# Patient Record
Sex: Female | Born: 1965
Health system: Southern US, Community
[De-identification: ages and names within clinical notes are randomized; demographics above are authoritative.]

## PROBLEM LIST (undated history)

## (undated) DIAGNOSIS — E785 Hyperlipidemia, unspecified: Secondary | ICD-10-CM

## (undated) HISTORY — PX: OTHER SURGICAL HISTORY: SHX169

---

## 2011-07-17 ENCOUNTER — Encounter (HOSPITAL_COMMUNITY): Payer: Self-pay

## 2012-07-16 ENCOUNTER — Other Ambulatory Visit: Payer: Self-pay | Admitting: Obstetrics and Gynecology

## 2012-07-16 ENCOUNTER — Encounter (HOSPITAL_COMMUNITY): Payer: Self-pay

## 2012-07-16 ENCOUNTER — Ambulatory Visit (HOSPITAL_COMMUNITY)
Admission: RE | Admit: 2012-07-16 | Discharge: 2012-07-16 | Disposition: A | Discharge status: Home / Self Care | Payer: Self-pay | Source: Ambulatory Visit | Attending: Obstetrics and Gynecology | Admitting: Obstetrics and Gynecology

## 2012-07-16 VITALS — BP 108/64 | Temp 97.8°F | Ht 61.0 in | Wt 139.6 lb

## 2012-07-16 DIAGNOSIS — N644 Mastodynia: Secondary | ICD-10-CM

## 2012-07-16 DIAGNOSIS — Z1239 Encounter for other screening for malignant neoplasm of breast: Secondary | ICD-10-CM

## 2012-07-16 HISTORY — DX: Hyperlipidemia, unspecified: E78.5

## 2012-07-16 NOTE — Progress Notes (Signed)
Complaints of change in size and tenderness in right breast   Pap Smear:    Pap smear not performed today. Per patient last Pap smear was November 2012 at [facility] and normal. Per patient she has no history of abnormal Pap smears. No Pap smear results in EPIC.  Physical exam: Breasts Right breast slightly larger than left breast. Per patient this is a change. No skin abnormalities bilateral breasts. No nipple retraction bilateral breasts. No nipple discharge bilateral breasts. No lymphadenopathy. No lumps palpated bilateral breasts. Patient complained of tenderness when palpated right outer breast and left lower breast.  Patient referred to the [facility] for Diagnostic Mammogram. Appointment scheduled for Monday, July 22, 2012 at 1430.        Pelvic/Bimanual No Pap smear completed today since last Pap smear was November 2012 and normal. Pap smear not indicated per BCCCP guidelines.

## 2012-07-16 NOTE — Patient Instructions (Signed)
Taught patient how to perform BSE and gave educational materials to take home. Patient did not need a Pap smear today due to last Pap smear was November 2012 per patient. Let her know BCCCP will cover Pap smears every 3 years unless has a history of abnormal Pap smears. Patient referred to the [facility] for Diagnostic Mammogram. Appointment scheduled for Monday, July 22, 2012 at 1430. Told patient she will need to get her films from her previous mammogram sent to the [facility] prior to her appointment. Patient aware of appointment and will be there. Patient verbalized understanding.

## 2012-07-31 ENCOUNTER — Ambulatory Visit
Admission: RE | Admit: 2012-07-31 | Discharge: 2012-07-31 | Disposition: A | Discharge status: Home / Self Care | Payer: No Typology Code available for payment source | Source: Ambulatory Visit | Attending: Obstetrics and Gynecology | Admitting: Obstetrics and Gynecology

## 2012-07-31 ENCOUNTER — Other Ambulatory Visit: Payer: Self-pay | Admitting: Obstetrics and Gynecology

## 2012-07-31 DIAGNOSIS — N644 Mastodynia: Secondary | ICD-10-CM

## 2013-01-29 DIAGNOSIS — K219 Gastro-esophageal reflux disease without esophagitis: Secondary | ICD-10-CM | POA: Insufficient documentation

## 2014-05-11 ENCOUNTER — Encounter (HOSPITAL_COMMUNITY): Payer: Self-pay

## 2015-02-26 ENCOUNTER — Encounter (HOSPITAL_COMMUNITY): Payer: Self-pay | Admitting: *Deleted

## 2015-02-26 ENCOUNTER — Emergency Department (HOSPITAL_COMMUNITY)
Admission: EM | Admit: 2015-02-26 | Discharge: 2015-03-01 | Disposition: A | Discharge status: Home / Self Care | Payer: Self-pay | Attending: Emergency Medicine | Admitting: Emergency Medicine

## 2015-02-26 DIAGNOSIS — F4329 Adjustment disorder with other symptoms: Secondary | ICD-10-CM | POA: Diagnosis present

## 2015-02-26 DIAGNOSIS — F4325 Adjustment disorder with mixed disturbance of emotions and conduct: Secondary | ICD-10-CM | POA: Insufficient documentation

## 2015-02-26 DIAGNOSIS — Z8639 Personal history of other endocrine, nutritional and metabolic disease: Secondary | ICD-10-CM | POA: Insufficient documentation

## 2015-02-26 DIAGNOSIS — F29 Unspecified psychosis not due to a substance or known physiological condition: Secondary | ICD-10-CM | POA: Diagnosis not present

## 2015-02-26 DIAGNOSIS — Z3202 Encounter for pregnancy test, result negative: Secondary | ICD-10-CM | POA: Insufficient documentation

## 2015-02-26 LAB — RAPID URINE DRUG SCREEN, HOSP PERFORMED
AMPHETAMINES: NOT DETECTED
BARBITURATES: NOT DETECTED
Benzodiazepines: NOT DETECTED
Cocaine: NOT DETECTED
OPIATES: NOT DETECTED
TETRAHYDROCANNABINOL: NOT DETECTED

## 2015-02-26 LAB — CBC
HEMATOCRIT: 38 % (ref 36.0–46.0)
HEMOGLOBIN: 12.6 g/dL (ref 12.0–15.0)
MCH: 27.2 pg (ref 26.0–34.0)
MCHC: 33.2 g/dL (ref 30.0–36.0)
MCV: 82.1 fL (ref 78.0–100.0)
Platelets: 253 10*3/uL (ref 150–400)
RBC: 4.63 MIL/uL (ref 3.87–5.11)
RDW: 13.4 % (ref 11.5–15.5)
WBC: 6.7 10*3/uL (ref 4.0–10.5)

## 2015-02-26 LAB — COMPREHENSIVE METABOLIC PANEL
ALBUMIN: 4.8 g/dL (ref 3.5–5.0)
ALK PHOS: 72 U/L (ref 38–126)
ALT: 17 U/L (ref 14–54)
AST: 22 U/L (ref 15–41)
Anion gap: 6 (ref 5–15)
BUN: 10 mg/dL (ref 6–20)
CHLORIDE: 108 mmol/L (ref 101–111)
CO2: 27 mmol/L (ref 22–32)
CREATININE: 0.43 mg/dL — AB (ref 0.44–1.00)
Calcium: 9.2 mg/dL (ref 8.9–10.3)
GFR calc Af Amer: >60 mL/min (ref >60)
GFR calc non Af Amer: >60 mL/min (ref >60)
GLUCOSE: 100 mg/dL — AB (ref 65–99)
Potassium: 3.8 mmol/L (ref 3.5–5.1)
SODIUM: 141 mmol/L (ref 135–145)
Total Bilirubin: 0.4 mg/dL (ref 0.3–1.2)
Total Protein: 7.9 g/dL (ref 6.5–8.1)

## 2015-02-26 LAB — SALICYLATE LEVEL

## 2015-02-26 LAB — ETHANOL: Alcohol, Ethyl (B): <5 mg/dL (ref <5)

## 2015-02-26 LAB — POC URINE PREG, ED: Preg Test, Ur: NEGATIVE

## 2015-02-26 LAB — ACETAMINOPHEN LEVEL: Acetaminophen (Tylenol), Serum: <10 ug/mL — ABNORMAL LOW (ref 10–30)

## 2015-02-26 NOTE — ED Provider Notes (Signed)
CSN: [id number]     Arrival date & time 02/26/15  1402 History  This chart was scribed for non-physician practitioner, Langston Masker, PA-C, working with Lorre Nick, MD, by Ronney Lion, ED Scribe. This patient was seen in room WTR3/[facility]PT3 and the patient's care was started at 3:46 PM.      Chief Complaint  Patient presents with  . IVC, Medical Clearance    The history is provided by the patient. No language interpreter was used.    HPI Comments: Melody Perkins is a 49 y.o. female who presents to the Emergency Department for IVC. Per nursing notes, patient's husband states she started acting strangely 2.5 years ago but has been acting increasingly strangely 1 month ago (see nursing note). Patient denies SI or HI. She denies a history of any chronic medical conditions, including hypertension or DM. She denies taking any daily medications. She denies ever having any counseling, including marital counseling. She denies abdominal pain.    Past Medical History  Diagnosis Date  . Hyperlipidemia    Past Surgical History  Procedure Laterality Date  . Cesarean section     Family History  Problem Relation Age of Onset  . Hyperlipidemia Father    Social History  Substance Use Topics  . Smoking status: Never Smoker   . Smokeless tobacco: Never Used  . Alcohol Use: No   OB History    Gravida Para Term Preterm AB TAB SAB Ectopic Multiple Living   4 2 0 Review of Systems  Gastrointestinal: Negative for abdominal pain.  Psychiatric/Behavioral: Negative for suicidal ideas.  All other systems reviewed and are negative.   Allergies  Buscopan  Home Medications   Prior to Admission medications   Not on File   BP 134/76 mmHg  Pulse 72  Temp(Src) 98.1 F (36.7 C) (Oral)  Resp 16  SpO2 95% Physical Exam  Constitutional: She is oriented to person, place, and time. She appears well-developed and well-nourished. No distress.  HENT:  Head: Normocephalic and atraumatic.  Eyes:  Conjunctivae and EOM are normal.  Neck: Neck supple. No tracheal deviation present.  Cardiovascular: Normal rate, regular rhythm, normal heart sounds and intact distal pulses.  Exam reveals no gallop and no friction rub.   No murmur heard. Pulmonary/Chest: Effort normal and breath sounds normal. No respiratory distress. She has no wheezes. She has no rales.  Abdominal: Soft. There is no tenderness.  Musculoskeletal: Normal range of motion.  Neurological: She is alert and oriented to person, place, and time.  Skin: Skin is warm and dry.  Psychiatric: She has a normal mood and affect. Her behavior is normal.  Nursing note and vitals reviewed.   ED Course  Procedures (including critical care time)  DIAGNOSTIC STUDIES: Oxygen Saturation is 95% on RA, adequate by my interpretation.    COORDINATION OF CARE: 3:49 PM - Discussed treatment plan with pt at bedside which includes telepsych. Pt verbalized understanding and agreed to plan.   Labs Review Labs Reviewed  COMPREHENSIVE METABOLIC PANEL - Abnormal; Notable for the following:    Glucose, Bld 100 (*)    Creatinine, Ser 0.43 (*)    All other components within normal limits  ACETAMINOPHEN LEVEL - Abnormal; Notable for the following:    Acetaminophen (Tylenol), Serum <10 (*)    All other components within normal limits  ETHANOL  SALICYLATE LEVEL  CBC  URINE RAPID DRUG SCREEN, HOSP PERFORMED  POC URINE PREG, ED  Imaging Review No results found. I have personally reviewed and evaluated these images and lab results as part of my medical decision-making.   EKG Interpretation None      MDM   Final diagnoses:  Medical clearance for psychiatric admission   I personally performed the services in this documentation, which was scribed in my presence.  The recorded information has been reviewed and considered.   Barnet Pall.    Lonia Skinner [place], PA-C 02/26/15 1939  Lorre Nick, MD 02/27/15 [phone]
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
## 2015-02-26 NOTE — ED Notes (Signed)
TTS unsure if pt needed to stay, pt IVC. Dr Freida Busman stated that psychiatry can resend IVC paperwork, but EDPs were not going to resend IVC paperwork.

## 2015-02-26 NOTE — ED Notes (Signed)
Bed: [facility]3 Expected date:  Expected time:  Means of arrival:  Comments: Pt still in room 

## 2015-02-26 NOTE — ED Notes (Signed)
Pt AAO x 3, NP Ijama at beside to eval pt.  No distress noted.  Monitoring for safety, Q 15 min checks in effect.
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
## 2015-02-26 NOTE — ED Notes (Signed)
pts husband took all belongings home.
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
## 2015-02-27 DIAGNOSIS — F323 Major depressive disorder, single episode, severe with psychotic features: Secondary | ICD-10-CM

## 2015-02-27 DIAGNOSIS — F29 Unspecified psychosis not due to a substance or known physiological condition: Secondary | ICD-10-CM | POA: Diagnosis not present

## 2015-02-27 MED ORDER — ACETAMINOPHEN 325 MG PO TABS
650.0000 mg | ORAL_TABLET | Freq: Four times a day (QID) | ORAL | Status: DC | PRN
  Administered 2015-02-27 – 2015-03-01 (×2): 650 mg via ORAL
  Filled 2015-02-27 (×2): qty 2

## 2015-02-27 NOTE — Progress Notes (Signed)
Disposition CSW completed patient referrals for the following inpatient psych facilities:  [facility]  CSW will continue to assist with placement.  [facility] Disposition CSW [phone]
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
## 2015-02-27 NOTE — ED Notes (Signed)
Pt AAO x 3, no distress noted, calm & cooperative, watching TV at present,  Pt given supplies for comfort care, requesting to shower.  Monitoring for safety, Q 15 min checks in effect.

## 2015-02-28 NOTE — BHH Suicide Risk Assessment (Signed)
  Discharge Assessment   [facility] Discharge Suicide Risk Assessment   Demographic Factors:  NA  Total Time spent with patient: 25 minutes  Musculoskeletal: Strength & Muscle Tone: within normal limits Gait & Station: normal Patient leans: N/A  Psychiatric Specialty Exam:     Blood pressure 130/75, pulse 67, temperature 98.4 F (36.9 C), temperature source Oral, resp. rate 16, SpO2 99 %.There is no weight on file to calculate BMI.    General Appearance: Casual and Guarded  Eye Contact:: Fair  Speech: Clear and Coherent and Normal Rate  Volume: Decreased  Mood: Anxious  Affect: Constricted and Depressed  Thought Process: Coherent and Goal Directed  Orientation: Full (Time, Place, and Person)  Thought Content: Patient denies any hallucination or any paranoia  Suicidal Thoughts: No  Homicidal Thoughts: No  Memory: Immediate; Fair Recent; Fair Remote; Fair  Judgement: Fair  Insight: Fair  Psychomotor Activity: Decreased  Concentration: Fair  Recall: [employer] of Knowledge:Fair  Language: Fair  Akathisia: No  Handed: Right  AIMS (if indicated):    Assets: Communication Skills Desire for Improvement Housing  ADL's: Intact  Cognition: WNL  Sleep:               Has this patient used any form of tobacco in the last 30 days? (Cigarettes, Smokeless Tobacco, Cigars, and/or Pipes) No  Mental Status Per Nursing Assessment::   On Admission:     Current Mental Status by Physician: Pt is alert/oriented x4, calm, cooperative, and appropriate for discharge. Denies suicidal/homicidal ideation and psychosis and does not appear to be responding to internal stimuli.   Loss Factors: Decrease in vocational status  Historical Factors: Impulsivity  Risk Reduction Factors:   Positive social support and Positive therapeutic relationship  Continued Clinical Symptoms:  Depression:   Impulsivity  Cognitive Features That  Contribute To Risk:  Closed-mindedness    Suicide Risk:  Minimal: No identifiable suicidal ideation.  Patients presenting with no risk factors but with morbid ruminations; may be classified as minimal risk based on the severity of the depressive symptoms  Principal Problem: Psychosis Discharge Diagnoses:  Patient Active Problem List   Diagnosis Date Noted  . Psychosis [F29] 02/27/2015  . Pain in breast [N64.4] 07/16/2012      Plan Of Care/Follow-up recommendations:  Activity:  As tolerated Diet:  Heart healthy with low sodium  Is patient on multiple antipsychotic therapies at discharge:  No   Has Patient had three or more failed trials of antipsychotic monotherapy by history:  No  Recommended Plan for Multiple Antipsychotic Therapies: NA  [place], John C, FNP-BC 02/28/2015, 1:10 PM
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
## 2015-02-28 NOTE — ED Notes (Signed)
[employer] at bedside to speak with pt also.
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
## 2015-02-28 NOTE — ED Notes (Signed)
Pt AAO x 3, no distress noted, calm & cooperative, interactive with staff,  Denies SI or HI.  Monitoring for safety, Q 15 min checks in effect.

## 2015-02-28 NOTE — BH Assessment (Signed)
Pt has been declined by [facility] due to aggression.

## 2015-02-28 NOTE — BH Assessment (Signed)
IVC paperwork has been faxed to [facility].
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
## 2015-03-01 DIAGNOSIS — F4329 Adjustment disorder with other symptoms: Secondary | ICD-10-CM | POA: Diagnosis present

## 2015-03-01 NOTE — Progress Notes (Signed)
Pt plans to discharge home with a friend. Pt to go to house to obtain belongings with police escort. Patient provided number [phone] if arrived to home and police escort not there. Patient also plans to file restraining order against spouse. Pt plans to go stay with friend. Pt thanked csw for concern and support.   Olga Coaster, LCSW  Clinical Social Work  [employer] [phone]

## 2015-03-01 NOTE — Discharge Instructions (Signed)
For your ongoing behavioral health needs you are advised to follow up with [facility].  New patients are seen at their walk-in clinic.  Walk-in hours are Monday - Friday from 8:00 am - 12:00 pm, and from 1:00 pm - 3:00 pm.  Walk-in patients are seen on a first come, first served basis, so try to arrive as early as possible for the best chance of being seen the same day.  There is an initial fee of $22.50: ° °     [facility] °     [address] °     [place], [place] [address] °     [phone] °
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
## 2015-03-01 NOTE — ED Notes (Signed)
Patient discharged with friend.  Denies thoughts of harm to self or others.  Denies auditory or visual hallucinations.  Patient states she is going to go to her friends home, but wants to get some personal effects and her passport.  She is afraid that her husband may not let her in to get them.  She was escorted out the side door where her friend picked her up.  When I came back to the unit I called [employer] non-emergent line and asked to have an officer dispatched to her home.  They will do that.  All belongings were given to the patient.

## 2015-03-01 NOTE — BH Assessment (Signed)
BHH Assessment Progress Note  Per Thedore Mins, MD, this pt does not require psychiatric hospitalization at this time. She is to be released from [facility] and discharged from [facility] with outpatient referrals. IVC has been rescinded. Discharge instructions include referral to [facility]. Pt's nurse, Dawnaly, has been notified.  Doylene Canning, MA Triage Specialist [phone]
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
## 2015-07-09 DIAGNOSIS — E785 Hyperlipidemia, unspecified: Secondary | ICD-10-CM | POA: Insufficient documentation

## 2016-02-25 ENCOUNTER — Ambulatory Visit (INDEPENDENT_AMBULATORY_CARE_PROVIDER_SITE_OTHER): Payer: 59

## 2016-02-25 ENCOUNTER — Encounter: Payer: Self-pay | Admitting: Family Medicine

## 2016-02-25 ENCOUNTER — Ambulatory Visit (INDEPENDENT_AMBULATORY_CARE_PROVIDER_SITE_OTHER): Payer: 59 | Admitting: Family Medicine

## 2016-02-25 DIAGNOSIS — M545 Low back pain, unspecified: Secondary | ICD-10-CM

## 2016-02-25 MED ORDER — CYCLOBENZAPRINE HCL 10 MG PO TABS: 10.0000 mg | ORAL_TABLET | Freq: Three times a day (TID) | ORAL | 0 refills | Status: DC | PRN

## 2016-02-25 MED ORDER — MELOXICAM 15 MG PO TABS: 15.0000 mg | ORAL_TABLET | Freq: Every day | ORAL | 1 refills | Status: DC

## 2016-02-25 NOTE — Patient Instructions (Addendum)
Follow-up as needed!  You x-ray was normal. . . cyclobenzaprine (FLEXERIL) 10 MG tablet    Sig: Take 1 tablet (10 mg total) by mouth 3 (three) times daily as needed for muscle spasms.  . meloxicam (MOBIC) 15 MG tablet    Sig: Take 1 tablet (15 mg total) by mouth daily.    IF you received an x-ray today, you will receive an invoice from [facility]. Please contact [facility] at [phone] with questions or concerns regarding your invoice.   IF you received labwork today, you will receive an invoice from [facility]. Please contact [facility] at [phone] with questions or concerns regarding your invoice.   Our billing staff will not be able to assist you with questions regarding bills from these companies.  You will be contacted with the lab results as soon as they are available. The fastest way to get your results is to activate your My Chart account. Instructions are located on the last page of this paperwork. If you have not heard from us regarding the results in 2 weeks, please contact this office.     Back Pain, Adult Back pain is very common in adults.The cause of back pain is rarely dangerous and the pain often gets better over time.The cause of your back pain may not be known. Some common causes of back pain include:  Strain of the muscles or ligaments supporting the spine.  Wear and tear (degeneration) of the spinal disks.  Arthritis.  Direct injury to the back. For many people, back pain may return. Since back pain is rarely dangerous, most people can learn to manage this condition on their own. HOME CARE INSTRUCTIONS Watch your back pain for any changes. The following actions may help to lessen any discomfort you are feeling:  Remain active. It is stressful on your back to sit or stand in one place for long periods of time. Do not sit, drive, or stand in one place for more than 30 minutes at a time. Take short walks on even  surfaces as soon as you are able.Try to increase the length of time you walk each day.  Exercise regularly as directed by your health care provider. Exercise helps your back heal faster. It also helps avoid future injury by keeping your muscles strong and flexible.  Do not stay in bed.Resting more than 1-2 days can delay your recovery.  Pay attention to your body when you bend and lift. The most comfortable positions are those that put less stress on your recovering back. Always use proper lifting techniques, including:  Bending your knees.  Keeping the load close to your body.  Avoiding twisting.  Find a comfortable position to sleep. Use a firm mattress and lie on your side with your knees slightly bent. If you lie on your back, put a pillow under your knees.  Avoid feeling anxious or stressed.Stress increases muscle tension and can worsen back pain.It is important to recognize when you are anxious or stressed and learn ways to manage it, such as with exercise.  Take medicines only as directed by your health care provider. Over-the-counter medicines to reduce pain and inflammation are often the most helpful.Your health care provider may prescribe muscle relaxant drugs.These medicines help dull your pain so you can more quickly return to your normal activities and healthy exercise.  Apply ice to the injured area:  Put ice in a plastic bag.  Place a towel between your skin and the bag.  Leave  the ice on for 20 minutes, 2-3 times a day for the first 2-3 days. After that, ice and heat may be alternated to reduce pain and spasms.  Maintain a healthy weight. Excess weight puts extra stress on your back and makes it difficult to maintain good posture. SEEK MEDICAL CARE IF:  You have pain that is not relieved with rest or medicine.  You have increasing pain going down into the legs or buttocks.  You have pain that does not improve in one week.  You have night pain.  You lose  weight.  You have a fever or chills. SEEK IMMEDIATE MEDICAL CARE IF:   You develop new bowel or bladder control problems.  You have unusual weakness or numbness in your arms or legs.  You develop nausea or vomiting.  You develop abdominal pain.  You feel faint.   This information is not intended to replace advice given to you by your health care provider. Make sure you discuss any questions you have with your health care provider.   Document Released: 06/26/2005 Document Revised: 07/17/2014 Document Reviewed: 10/28/2013 Elsevier Interactive Patient Education [employer]2016 Elsevier Inc.

## 2016-02-25 NOTE — Progress Notes (Signed)
Patient ID: Melody Perkins, female    DOB: 09-19-1965, 50 y.o.   MRN: [id number]  PCP: No PCP Per Patient  Chief Complaint  Patient presents with  . Back Pain    YESTERDAY    Subjective:   HPI Presents for evaluation of injured back while bending two days ago.  She was bending orward to pick up an object off the floor and felt the sensation of something "popping" in her lower lumbar-sacral region.She reports some difficulty walking and sitting down. Rate pain 8/10. Pain improves with lying flat.  She has used biofreeze and advil with minimal relief of symptoms.  . Social History   Social History  . Marital status: Married    Spouse name: N/A  . Number of children: N/A  . Years of education: N/A   Occupational History  . Not on file.   Social History Main Topics  . Smoking status: Never Smoker  . Smokeless tobacco: Never Used  . Alcohol use No  . Drug use: No  . Sexual activity: Not on file   Other Topics Concern  . Not on file   Social History Narrative  . No narrative on file   . Family History  Problem Relation Age of Onset  . Hyperlipidemia Father     Review of Systems  Musculoskeletal: Positive for back pain.       Patient Active Problem List   Diagnosis Date Noted  . Adjustment disorder with disturbance of emotion 03/01/2015  . Pain in breast 07/16/2012     Prior to Admission medications   Not on File     Allergies  Allergen Reactions  . Buscopan [Scopolamine] Palpitations       Objective:  Physical Exam  Constitutional: She is oriented to person, place, and time. She appears well-developed and well-nourished.  HENT:  Head: Normocephalic and atraumatic.  Right Ear: External ear normal.  Left Ear: External ear normal.  Nose: Nose normal.  Eyes: Pupils are equal, round, and reactive to light.  Neck: Normal range of motion.  Cardiovascular: Normal rate and regular rhythm.   Pulmonary/Chest: Effort normal and breath sounds normal.    Musculoskeletal:  Grimaces with walking. DTR patellar and Achilles intact, brisk. Active flexion (reports tension instead of pain).   Neurological: She is alert and oriented to person, place, and time.  Skin: Skin is warm and dry.  Psychiatric: She has a normal mood and affect. Her behavior is normal. Judgment and thought content normal.   .Dg Lumbar Spine Complete  Result Date: 02/25/2016 CLINICAL DATA:  Back pain. EXAM: LUMBAR SPINE - COMPLETE 4+ VIEW COMPARISON:  No recent prior . FINDINGS: No acute bony abnormality identified. Normal alignment and mineralization. IMPRESSION: No acute or focal abnormality. Electronically Signed   By: Maisie Fushomas  Register   On: 02/25/2016 11:16   Vitals:   02/25/16 1035  BP: 120/70  Pulse: (!) 51  Resp: 16  Temp: 98.1 F (36.7 C)   Assessment & Plan:  .1. Bilateral low back pain without sciatica - DG Lumbar Spine Complete-no acute findings  Start:  . cyclobenzaprine (FLEXERIL) 10 MG tablet    Sig: Take 1 tablet (10 mg total) by mouth 3 (three) times daily as needed for muscle spasms.  . meloxicam (MOBIC) 15 MG tablet    Sig: Take 1 tablet (15 mg total) by mouth daily.   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  Follow-up as needed.  Godfrey PickKimberly S. Tiburcio PeaHarris, MSN, FNP-C [facility]

## 2016-03-23 ENCOUNTER — Telehealth: Payer: Self-pay

## 2016-03-23 ENCOUNTER — Other Ambulatory Visit (HOSPITAL_COMMUNITY): Payer: Self-pay | Admitting: Psychiatry

## 2016-03-23 DIAGNOSIS — Z1231 Encounter for screening mammogram for malignant neoplasm of breast: Secondary | ICD-10-CM

## 2016-03-23 DIAGNOSIS — Z1239 Encounter for other screening for malignant neoplasm of breast: Secondary | ICD-10-CM

## 2016-03-23 NOTE — Telephone Encounter (Signed)
Patient request a referral to have a mammogram done. No concerns. Just for a general routine. u [phone].

## 2016-03-27 ENCOUNTER — Ambulatory Visit
Admission: RE | Admit: 2016-03-27 | Discharge: 2016-03-27 | Disposition: A | Discharge status: Home / Self Care | Payer: 59 | Source: Ambulatory Visit | Attending: Psychiatry | Admitting: Psychiatry

## 2016-03-27 ENCOUNTER — Other Ambulatory Visit: Payer: Self-pay | Admitting: Emergency Medicine

## 2016-03-27 DIAGNOSIS — Z1231 Encounter for screening mammogram for malignant neoplasm of breast: Secondary | ICD-10-CM | POA: Diagnosis not present

## 2016-03-28 NOTE — Telephone Encounter (Signed)
She was seen by Joaquin CourtsKimberly Harris, NP as the chart shows--not me.  I will place the mammogram. Please alert patient.

## 2016-03-29 NOTE — Telephone Encounter (Signed)
Thanks

## 2016-03-29 NOTE — Telephone Encounter (Signed)
Order has been sent to [facility]

## 2016-04-24 ENCOUNTER — Ambulatory Visit (INDEPENDENT_AMBULATORY_CARE_PROVIDER_SITE_OTHER): Payer: 59 | Admitting: Family Medicine

## 2016-04-24 VITALS — BP 150/94 | HR 58 | Temp 98.2°F | Resp 17 | Ht 61.5 in | Wt 143.0 lb

## 2016-04-24 DIAGNOSIS — K649 Unspecified hemorrhoids: Secondary | ICD-10-CM

## 2016-04-24 DIAGNOSIS — Z01419 Encounter for gynecological examination (general) (routine) without abnormal findings: Secondary | ICD-10-CM | POA: Diagnosis not present

## 2016-04-24 DIAGNOSIS — Z1211 Encounter for screening for malignant neoplasm of colon: Secondary | ICD-10-CM | POA: Diagnosis not present

## 2016-04-24 LAB — POCT WET + KOH PREP
Trich by wet prep: ABSENT
Yeast by KOH: ABSENT
Yeast by wet prep: ABSENT

## 2016-04-24 LAB — POCT URINALYSIS DIP (MANUAL ENTRY)
Bilirubin, UA: NEGATIVE
GLUCOSE UA: NEGATIVE
Ketones, POC UA: NEGATIVE
LEUKOCYTES UA: NEGATIVE
Nitrite, UA: NEGATIVE
PROTEIN UA: NEGATIVE
SPEC GRAV UA: 1.015
UROBILINOGEN UA: 0.2
pH, UA: 7

## 2016-04-24 LAB — POC MICROSCOPIC URINALYSIS (UMFC): Mucus: ABSENT

## 2016-04-24 NOTE — Patient Instructions (Signed)
     IF you received an x-ray today, you will receive an invoice from [facility]. Please contact [facility] at [phone] with questions or concerns regarding your invoice.   IF you received labwork today, you will receive an invoice from [facility]. Please contact [facility] at [phone] with questions or concerns regarding your invoice.   Our billing staff will not be able to assist you with questions regarding bills from these companies.  You will be contacted with the lab results as soon as they are available. The fastest way to get your results is to activate your My Chart account. Instructions are located on the last page of this paperwork. If you have not heard from us regarding the results in 2 weeks, please contact this office.      

## 2016-04-24 NOTE — Progress Notes (Signed)
Patient ID: Donny PiqueDilva Plancarte, female    DOB: 09-28-1965, 50 y.o.   MRN: [id number]  PCP: No PCP Per Patient  No chief complaint on file.   Subjective:   HPI [age] year old, female, presents for routine gynecology visit and a referral colonoscopy. Patient reports that she typically receives her gynecological care in [place].  Denies vaginal discharge, bleeding, discharge dysuria, or painful intercourse. Reports all prior PAP smears were normal that she had performed in [place]. Only other acute problem is that she complains of hemorrhoid that is painful with defecation. She denies rectal bleeding or constipation.  Social History   Social History  . Marital status: Married    Spouse name: N/A  . Number of children: N/A  . Years of education: N/A   Occupational History  . Not on file.   Social History Main Topics  . Smoking status: Never Smoker  . Smokeless tobacco: Never Used  . Alcohol use No  . Drug use: No  . Sexual activity: Not on file   Other Topics Concern  . Not on file   Social History Narrative  . No narrative on file    Family History  Problem Relation Age of Onset  . Hyperlipidemia Father    Review of Systems See HPI  Patient Active Problem List   Diagnosis Date Noted  . Low back pain 02/25/2016  . Adjustment disorder with disturbance of emotion 03/01/2015  . Pain in breast 07/16/2012     Prior to Admission medications   Not on File     Allergies  Allergen Reactions  . Buscopan [Scopolamine] Palpitations       Objective:  Physical Exam  Constitutional: She is oriented to person, place, and time. She appears well-developed.  HENT:  Head: Normocephalic and atraumatic.  Right Ear: External ear normal.  Left Ear: External ear normal.  Nose: Nose normal.  Eyes: Conjunctivae and EOM are normal. Pupils are equal, round, and reactive to light.  Neck: Normal range of motion. Neck supple.  Cardiovascular: Normal rate, regular rhythm, normal heart  sounds and intact distal pulses.  Exam reveals no gallop.   Pulmonary/Chest: Effort normal and breath sounds normal.  Abdominal: Soft.  Genitourinary: Rectal exam shows external hemorrhoid and tenderness.  Genitourinary Comments: Breasts are symmetric without cutaneous changes, nipple inversion or discharge. No masses or tenderness, and no axillary lymphadenopathy.  Normal female external genitalia without lesion. No inguinal lymphadenopathy. Vaginal mucosa is pink and moist without lesions. Cervix is without discharge, non friable. Pap smear obtained. No cervical motion tenderness, adnexal fullness or tenderness.  Musculoskeletal: Normal range of motion.  Neurological: She is alert and oriented to person, place, and time.  Skin: Skin is warm and dry.  Psychiatric: Her behavior is normal. Judgment and thought content normal. Her mood appears anxious. Cognition and memory are normal.   Vitals:   04/24/16 1120  BP: (!) 150/94  Pulse: (!) 58  Resp: 17  Temp: 98.2 F (36.8 C)      Assessment & Plan:  1. Encounter for well woman exam with routine gynecological exam - Pap IG and HPV (high risk) DNA detection - POCT Microscopic Urinalysis ([facility]) - POCT Wet + KOH Prep - POCT urinalysis dipstick  2. Screening for colon cancer - Ambulatory referral to Gastroenterology  3. Hemorrhoids, unspecified hemorrhoid type, non-thrombosed  Plan: -Hydrocortisone (Anusol-HC) 25 mg suppository  Insert rectally 2 times daily   Godfrey PickKimberly S. Tiburcio PeaHarris, MSN, FNP-C [facility]

## 2016-04-25 ENCOUNTER — Telehealth: Payer: Self-pay

## 2016-04-25 DIAGNOSIS — K5900 Constipation, unspecified: Secondary | ICD-10-CM | POA: Diagnosis not present

## 2016-04-25 DIAGNOSIS — Z1211 Encounter for screening for malignant neoplasm of colon: Secondary | ICD-10-CM | POA: Diagnosis not present

## 2016-04-25 NOTE — Telephone Encounter (Signed)
Patient has a referral from us and states she needs an appointment by November before her insurance becomes in active. Please advise! [phone]

## 2016-04-25 NOTE — Telephone Encounter (Signed)
Because it is not an urgent referral, the likelihood of it being scheduled that quickly is very slim as gastro offices are all scheduling months out. I will note on the referral when I send it out, but we cannot guarantee. I will call pt once the referral is sent out.

## 2016-04-26 LAB — PAP IG AND HPV HIGH-RISK: HPV DNA High Risk: NOT DETECTED

## 2016-04-27 ENCOUNTER — Encounter: Payer: Self-pay | Admitting: Family Medicine

## 2016-04-27 NOTE — Progress Notes (Signed)
April 27, 2016   Donny PiqueDilva Viegas [address] [place] [id number]   Dear Ms. Michelle PiperGuy,  Below are the results from your recent visit were normal.  Resulted Orders  Pap IG and HPV (high risk) DNA detection  Result Value Ref Range   HPV DNA High Risk Not Detected      Comment:     HIGH RISK HPV types (16,18,31,33,35,39,45,51,52,56,58,59,66,68) were not detected. Other HPV types which cause anogenital lesions may be present. The significance of the other types of HPV in malignant  processes has not been established.                  ** Normal Reference Range: Not Detected **      HPV High Risk testing performed using the APTIMA HPV mRNA Assay.      Specimen adequacy: SEE NOTE      Comment:     SATISFACTORY.  Endocervical/transformation zone component absent/insufficient.    FINAL DIAGNOSIS: SEE NOTE      Comment:     - NEGATIVE FOR INTRAEPITHELIAL LESIONS OR MALIGNANCY.    COMMENTS: SEE NOTE      Comment:     This Pap test has been evaluated with computer assisted technology.   Cytotechnologist: SEE NOTE      Comment:     JRW, BS CT(ASCP)   QC Cytotechnologist: SEE NOTE      Comment:     ATJ, BS CT(ASCP) *  The Pap is a screening test for cervical cancer. It is not a  diagnostic test and is subject to false negative and false positive  results. It is most reliable when a satisfactory sample, regularly  obtained, is submitted with relevant clinical findings and history,  and when the Pap result is evaluated along with historic and current  clinical information.    Narrative   Performed at:  [facility]                [address], [address]                [place], [place] [address]     If you have any questions or concerns, please don't hesitate to call.  Sincerely,   Joaquin CourtsKimberly Zilpha Mcandrew, FNP

## 2016-04-28 ENCOUNTER — Encounter: Payer: Self-pay | Admitting: Family Medicine

## 2016-04-28 MED ORDER — HYDROCORTISONE ACETATE 25 MG RE SUPP: 25.0000 mg | Freq: Two times a day (BID) | RECTAL | 0 refills | Status: DC

## 2016-05-03 DIAGNOSIS — Z1211 Encounter for screening for malignant neoplasm of colon: Secondary | ICD-10-CM | POA: Diagnosis not present

## 2016-05-03 LAB — HM COLONOSCOPY

## 2016-05-10 ENCOUNTER — Ambulatory Visit (INDEPENDENT_AMBULATORY_CARE_PROVIDER_SITE_OTHER): Payer: 59 | Admitting: Physician Assistant

## 2016-05-10 VITALS — BP 102/62 | HR 69 | Temp 98.4°F | Resp 18 | Ht 62.5 in | Wt 141.8 lb

## 2016-05-10 DIAGNOSIS — J019 Acute sinusitis, unspecified: Secondary | ICD-10-CM

## 2016-05-10 MED ORDER — GUAIFENESIN ER 1200 MG PO TB12: 1.0000 | ORAL_TABLET | Freq: Two times a day (BID) | ORAL | 1 refills | Status: DC | PRN

## 2016-05-10 MED ORDER — AMOXICILLIN-POT CLAVULANATE 875-125 MG PO TABS: 1.0000 | ORAL_TABLET | Freq: Two times a day (BID) | ORAL | 0 refills | Status: DC

## 2016-05-10 NOTE — Progress Notes (Signed)
Please take the Augmentin twice a day per our discussion. This will be for 10 days. He will use the Mucinex 1200 mg every 12 hours. You can use up call lozenges for throat pain. You can also use Tylenol or ibuprofen every 6-8 hours. Make sure that you're hydrating well with 64 ounces of water each day. If your symptoms do not improve within the next 7 days, please return.     IF you received an x-ray today, you will receive an invoice from [facility]. Please contact [facility] at [phone] with questions or concerns regarding your invoice.   IF you received labwork today, you will receive an invoice from [facility]. Please contact [facility] at [phone] with questions or concerns regarding your invoice.   Our billing staff will not be able to assist you with questions regarding bills from these companies.  You will be contacted with the lab results as soon as they are available. The fastest way to get your results is to activate your My Chart account. Instructions are located on the last page of this paperwork. If you have not heard from us regarding the results in 2 weeks, please contact this office.

## 2016-05-10 NOTE — Patient Instructions (Signed)
Please take the Augmentin twice a day per our discussion. This will be for 10 days. He will use the Mucinex 1200 mg every 12 hours. You can use up call lozenges for throat pain. You can also use Tylenol or ibuprofen every 6-8 hours. Make sure that you're hydrating well with 64 ounces of water each day. If your symptoms do not improve within the next 7 days, please return.  Sinusitis, Adult Sinusitis is redness, soreness, and inflammation of the paranasal sinuses. Paranasal sinuses are air pockets within the bones of your face. They are located beneath your eyes, in the middle of your forehead, and above your eyes. In healthy paranasal sinuses, mucus is able to drain out, and air is able to circulate through them by way of your nose. However, when your paranasal sinuses are inflamed, mucus and air can become trapped. This can allow bacteria and other germs to grow and cause infection. Sinusitis can develop quickly and last only a short time (acute) or continue over a long period (chronic). Sinusitis that lasts for more than 12 weeks is considered chronic. CAUSES Causes of sinusitis include:  Allergies.  Structural abnormalities, such as displacement of the cartilage that separates your nostrils (deviated septum), which can decrease the air flow through your nose and sinuses and affect sinus drainage.  Functional abnormalities, such as when the small hairs (cilia) that line your sinuses and help remove mucus do not work properly or are not present. SIGNS AND SYMPTOMS Symptoms of acute and chronic sinusitis are the same. The primary symptoms are pain and pressure around the affected sinuses. Other symptoms include:  Upper toothache.  Earache.  Headache.  Bad breath.  Decreased sense of smell and taste.  A cough, which worsens when you are lying flat.  Fatigue.  Fever.  Thick drainage from your nose, which often is green and may contain pus (purulent).  Swelling and warmth over the  affected sinuses. DIAGNOSIS Your health care provider will perform a physical exam. During your exam, your health care provider may perform any of the following to help determine if you have acute sinusitis or chronic sinusitis:  Look in your nose for signs of abnormal growths in your nostrils (nasal polyps).  Tap over the affected sinus to check for signs of infection.  View the inside of your sinuses using an imaging device that has a light attached (endoscope). If your health care provider suspects that you have chronic sinusitis, one or more of the following tests may be recommended:  Allergy tests.  Nasal culture. A sample of mucus is taken from your nose, sent to a lab, and screened for bacteria.  Nasal cytology. A sample of mucus is taken from your nose and examined by your health care provider to determine if your sinusitis is related to an allergy. TREATMENT Most cases of acute sinusitis are related to a viral infection and will resolve on their own within 10 days. Sometimes, medicines are prescribed to help relieve symptoms of both acute and chronic sinusitis. These may include pain medicines, decongestants, nasal steroid sprays, or saline sprays. However, for sinusitis related to a bacterial infection, your health care provider will prescribe antibiotic medicines. These are medicines that will help kill the bacteria causing the infection. Rarely, sinusitis is caused by a fungal infection. In these cases, your health care provider will prescribe antifungal medicine. For some cases of chronic sinusitis, surgery is needed. Generally, these are cases in which sinusitis recurs more than 3 times per year,  despite other treatments. HOME CARE INSTRUCTIONS  Drink plenty of water. Water helps thin the mucus so your sinuses can drain more easily.  Use a humidifier.  Inhale steam 3-4 times a day (for example, sit in the bathroom with the shower running).  Apply a warm, moist washcloth to  your face 3-4 times a day, or as directed by your health care provider.  Use saline nasal sprays to help moisten and clean your sinuses.  Take medicines only as directed by your health care provider.  If you were prescribed either an antibiotic or antifungal medicine, finish it all even if you start to feel better. SEEK IMMEDIATE MEDICAL CARE IF:  You have increasing pain or severe headaches.  You have nausea, vomiting, or drowsiness.  You have swelling around your face.  You have vision problems.  You have a stiff neck.  You have difficulty breathing.   This information is not intended to replace advice given to you by your health care provider. Make sure you discuss any questions you have with your health care provider.   Document Released: 06/26/2005 Document Revised: 07/17/2014 Document Reviewed: 07/11/2011 Elsevier Interactive Patient Education [employer]2016 Elsevier Inc.

## 2016-05-12 NOTE — Progress Notes (Signed)
[facility] [address], [place] [address] [phone]  Date:  05/10/2016   Name:  Melody Perkins   DOB:  04-May-1966   MRN:  [id number]  PCP:  No PCP Per Patient    History of Present Illness:  Melody Perkins Michelle Perkins is a 50 y.o. female patient who [facility] for cc of sore throat, and headache. Patient states that she developed a sore throat and headache.   She has nasal congestion, and cough for more than 1 week.  It is worse in the morning. as attempted theraflu and ibuprofen which help minimally. She has sinus pressure and tenderness.  She has sore throat. Feeling of malaise. No sneezing or watery eyes. No sob or dyspnea. She does not hydrate well at this time.   Patient Active Problem List   Diagnosis Date Noted  . Low back pain 02/25/2016  . Adjustment disorder with disturbance of emotion 03/01/2015  . Pain in breast 07/16/2012    Past Medical History:  Diagnosis Date  . Hyperlipidemia     Past Surgical History:  Procedure Laterality Date  . CESAREAN SECTION      Social History  Substance Use Topics  . Smoking status: Never Smoker  . Smokeless tobacco: Never Used  . Alcohol use No    Family History  Problem Relation Age of Onset  . Hyperlipidemia Father     Allergies  Allergen Reactions  . Buscopan [Scopolamine] Palpitations    Medication list has been reviewed and updated.  Current Outpatient Prescriptions on File Prior to Visit  Medication Sig Dispense Refill  . hydrocortisone (ANUSOL-HC) 25 MG suppository Place 1 suppository (25 mg total) rectally 2 (two) times daily. (Patient not taking: Reported on 05/10/2016) 12 suppository 0   No current facility-administered medications on file prior to visit.     ROS ROS otherwise unremarkable unless listed above.   Physical Examination: BP 102/62 (BP Location: Right Arm, Patient Position: Sitting, Cuff Size: Normal)   Pulse 69   Temp 98.4 F (36.9 C) (Oral)   Resp 18   Ht 5' 2.5"  (1.588 m)   Wt 141 lb 12.8 oz (64.3 kg)   SpO2 96%   BMI 25.52 kg/m  Ideal Body Weight: Weight in (lb) to have BMI = 25: 138.6  Physical Exam  Constitutional: She is oriented to person, place, and time. She appears well-developed and well-nourished. No distress.  HENT:  Head: Normocephalic and atraumatic.  Right Ear: Tympanic membrane, external ear and ear canal normal.  Left Ear: Tympanic membrane, external ear and ear canal normal.  Nose: Mucosal edema and rhinorrhea present. Right sinus exhibits maxillary sinus tenderness. Right sinus exhibits no frontal sinus tenderness. Left sinus exhibits maxillary sinus tenderness. Left sinus exhibits no frontal sinus tenderness.  Mouth/Throat: No uvula swelling. No oropharyngeal exudate, posterior oropharyngeal edema or posterior oropharyngeal erythema.  Eyes: Conjunctivae and EOM are normal. Pupils are equal, round, and reactive to light.  Cardiovascular: Normal rate and regular rhythm.  Exam reveals no gallop, no distant heart sounds and no friction rub.   No murmur heard. Pulmonary/Chest: Effort normal. No respiratory distress. She has no decreased breath sounds. She has no wheezes. She has no rhonchi.  Lymphadenopathy:       Head (right side): No submandibular, no tonsillar, no preauricular and no posterior auricular adenopathy present.       Head (left side): No submandibular, no tonsillar, no preauricular and no posterior auricular adenopathy present.  Neurological: She  is alert and oriented to person, place, and time.  Skin: She is not diaphoretic.  Psychiatric: She has a normal mood and affect. Her behavior is normal.     Assessment and Plan: Melody Perkins is a 50 y.o. female who is here today for cc of sinus pain. Subacute sinusitis, unspecified location - Plan: amoxicillin-clavulanate (AUGMENTIN) 875-125 MG tablet, Guaifenesin (MUCINEX MAXIMUM STRENGTH) 1200 MG TB12  Trena PlattStephanie Tosha Belgarde, PA-C [facility] 11/3/20179:27 AM

## 2016-06-15 ENCOUNTER — Encounter: Payer: Self-pay | Admitting: Family Medicine

## 2016-08-04 ENCOUNTER — Ambulatory Visit (INDEPENDENT_AMBULATORY_CARE_PROVIDER_SITE_OTHER): Payer: 59 | Admitting: Family Medicine

## 2016-08-04 VITALS — BP 112/70 | HR 77 | Temp 99.3°F | Resp 16 | Ht 62.5 in | Wt 141.0 lb

## 2016-08-04 DIAGNOSIS — B9789 Other viral agents as the cause of diseases classified elsewhere: Secondary | ICD-10-CM

## 2016-08-04 DIAGNOSIS — J069 Acute upper respiratory infection, unspecified: Secondary | ICD-10-CM | POA: Diagnosis not present

## 2016-08-04 DIAGNOSIS — Z20828 Contact with and (suspected) exposure to other viral communicable diseases: Secondary | ICD-10-CM

## 2016-08-04 MED ORDER — OSELTAMIVIR PHOSPHATE 75 MG PO CAPS: 75.0000 mg | ORAL_CAPSULE | Freq: Every day | ORAL | 0 refills | Status: AC

## 2016-08-04 NOTE — Progress Notes (Signed)
  Chief Complaint  Patient presents with  . Flu like symptoms  . Headache    HPI Pt reports that she received flu vaccines in October 2018 She reports that her husband is positive for influenza A She states that she has runny nose and chills She denies immunocompromise, asthma or diabetes.  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  Past Medical History:  Diagnosis Date  . Hyperlipidemia     Current Outpatient Prescriptions  Medication Sig Dispense Refill  . menthol-cetylpyridinium (CEPACOL) 3 MG lozenge Take 1 lozenge by mouth as needed for sore throat.    Marland Kitchen. oseltamivir (TAMIFLU) 75 MG capsule Take 1 capsule (75 mg total) by mouth daily. 10 capsule 0   No current facility-administered medications for this visit.     Allergies:  Allergies  Allergen Reactions  . Buscopan [Scopolamine] Palpitations    Past Surgical History:  Procedure Laterality Date  . CESAREAN SECTION      Social History   Social History  . Marital status: Married    Spouse name: N/A  . Number of children: N/A  . Years of education: N/A   Social History Main Topics  . Smoking status: Never Smoker  . Smokeless tobacco: Never Used  . Alcohol use No  . Drug use: No  . Sexual activity: Not Asked   Other Topics Concern  . None   Social History Narrative  . None    ROS  Objective: Vitals:   08/04/16 1106  BP: 112/70  Pulse: 77  Resp: 16  Temp: 99.3 F (37.4 C)  TempSrc: Oral  SpO2: 97%  Weight: 141 lb (64 kg)  Height: 5' 2.5" (1.588 m)    Physical Exam General: alert, oriented, in NAD Head: normocephalic, atraumatic, no sinus tenderness Eyes: EOM intact, no scleral icterus or conjunctival injection Ears: TM clear bilaterally Throat: no pharyngeal exudate or erythema Lymph: no posterior auricular, submental or cervical lymph adenopathy Heart: normal rate, normal sinus rhythm, no murmurs Lungs: clear to auscultation bilaterally, no wheezing   Assessment and  Plan Melody Perkins was seen today for flu like symptoms and headache.  Diagnoses and all orders for this visit:  Viral URI Exposure to influenza  -  Discussed that she should take tamiflu, tylenol, motrin -  Aggressive oral hydration -     oseltamivir (TAMIFLU) 75 MG capsule; Take 1 capsule (75 mg total) by mouth daily.     Nadir Vasques A Soloman Mckeithan

## 2016-08-04 NOTE — Patient Instructions (Addendum)
     IF you received an x-ray today, you will receive an invoice from [facility]. Please contact [facility] at [phone] with questions or concerns regarding your invoice.   IF you received labwork today, you will receive an invoice from [facility]. Please contact [facility] at [phone] with questions or concerns regarding your invoice.   Our billing staff will not be able to assist you with questions regarding bills from these companies.  You will be contacted with the lab results as soon as they are available. The fastest way to get your results is to activate your My Chart account. Instructions are located on the last page of this paperwork. If you have not heard from us regarding the results in 2 weeks, please contact this office.      Influenza, Adult Influenza ("the flu") is an infection in the lungs, nose, and throat (respiratory tract). It is caused by a virus. The flu causes many common cold symptoms, as well as a high fever and body aches. It can make you feel very sick. The flu spreads easily from person to person (is contagious). Getting a flu shot (influenza vaccination) every year is the best way to prevent the flu. Follow these instructions at home:  Take over-the-counter and prescription medicines only as told by your doctor.  Use a cool mist humidifier to add moisture (humidity) to the air in your home. This can make it easier to breathe.  Rest as needed.  Drink enough fluid to keep your pee (urine) clear or pale yellow.  Cover your mouth and nose when you cough or sneeze.  Wash your hands with soap and water often, especially after you cough or sneeze. If you cannot use soap and water, use hand sanitizer.  Stay home from work or school as told by your doctor. Unless you are visiting your doctor, try to avoid leaving home until your fever has been gone for 24 hours without the use of medicine.  Keep all follow-up visits as told by your doctor.  This is important. How is this prevented?  Getting a yearly (annual) flu shot is the best way to avoid getting the flu. You may get the flu shot in late summer, fall, or winter. Ask your doctor when you should get your flu shot.  Wash your hands often or use hand sanitizer often.  Avoid contact with people who are sick during cold and flu season.  Eat healthy foods.  Drink plenty of fluids.  Get enough sleep.  Exercise regularly. Contact a doctor if:  You get new symptoms.  You have:  Chest pain.  Watery poop (diarrhea).  A fever.  Your cough gets worse.  You start to have more mucus.  You feel sick to your stomach (nauseous).  You throw up (vomit). Get help right away if:  You start to be short of breath or have trouble breathing.  Your skin or nails turn a bluish color.  You have very bad pain or stiffness in your neck.  You get a sudden headache.  You get sudden pain in your face or ear.  You cannot stop throwing up. This information is not intended to replace advice given to you by your health care provider. Make sure you discuss any questions you have with your health care provider. Document Released: 04/04/2008 Document Revised: 12/02/2015 Document Reviewed: 04/20/2015 Elsevier Interactive Patient Education  2017 Elsevier Inc.  

## 2016-08-06 DIAGNOSIS — Z20828 Contact with and (suspected) exposure to other viral communicable diseases: Secondary | ICD-10-CM | POA: Diagnosis not present

## 2016-10-27 DIAGNOSIS — H1031 Unspecified acute conjunctivitis, right eye: Secondary | ICD-10-CM | POA: Diagnosis not present

## 2016-11-09 ENCOUNTER — Emergency Department (HOSPITAL_COMMUNITY)
Admission: EM | Admit: 2016-11-09 | Discharge: 2016-11-09 | Disposition: A | Discharge status: Home / Self Care | Payer: 59 | Attending: Emergency Medicine | Admitting: Emergency Medicine

## 2016-11-09 ENCOUNTER — Encounter (HOSPITAL_COMMUNITY): Payer: Self-pay

## 2016-11-09 DIAGNOSIS — Y939 Activity, unspecified: Secondary | ICD-10-CM | POA: Insufficient documentation

## 2016-11-09 DIAGNOSIS — Y929 Unspecified place or not applicable: Secondary | ICD-10-CM | POA: Insufficient documentation

## 2016-11-09 DIAGNOSIS — Y999 Unspecified external cause status: Secondary | ICD-10-CM | POA: Diagnosis not present

## 2016-11-09 DIAGNOSIS — Z79899 Other long term (current) drug therapy: Secondary | ICD-10-CM | POA: Diagnosis not present

## 2016-11-09 DIAGNOSIS — W57XXXA Bitten or stung by nonvenomous insect and other nonvenomous arthropods, initial encounter: Secondary | ICD-10-CM | POA: Diagnosis not present

## 2016-11-09 DIAGNOSIS — S70361A Insect bite (nonvenomous), right thigh, initial encounter: Secondary | ICD-10-CM | POA: Insufficient documentation

## 2016-11-09 MED ORDER — SULFAMETHOXAZOLE-TRIMETHOPRIM 800-160 MG PO TABS: 1.0000 | ORAL_TABLET | Freq: Two times a day (BID) | ORAL | 0 refills | Status: AC

## 2016-11-09 MED ORDER — SULFAMETHOXAZOLE-TRIMETHOPRIM 800-160 MG PO TABS
1.0000 | ORAL_TABLET | Freq: Once | ORAL | Status: AC
  Administered 2016-11-09: 1 via ORAL
  Filled 2016-11-09: qty 1

## 2016-11-09 MED FILL — SULFAMETHOXAZOLE/TMP DS TAB: 800-160 | 5 days supply | Qty: 10 | Fill #0

## 2016-11-09 NOTE — ED Triage Notes (Signed)
Pt complains of a possible spider bite on her thigh, she states that she noticed it this am in the shower, she is very concerned because this recently happened to her son

## 2016-11-09 NOTE — ED Notes (Signed)
Signature pad not working. Patient verbalized understanding discharge instructions. 

## 2016-11-09 NOTE — Discharge Instructions (Signed)
As discussed, your evaluation today has been largely reassuring.  But, it is important that you monitor your condition carefully, and do not hesitate to return to the ED if you develop new, or concerning changes in your condition.  Please observe the area frequently and take the prescription as directed.  Otherwise, please follow-up with your physician for appropriate ongoing care.

## 2016-11-09 NOTE — ED Provider Notes (Signed)
[facility]-EMERGENCY DEPT Provider Note   CSN: [id number] Arrival date & time: 11/09/16  0620     History   Chief Complaint Chief Complaint  Patient presents with  . Insect Bite    HPI Melody Perkins is a 51 y.o. female.  HPI  Patient presents concern of a cutaneous lesion. It is 7 AM, patient believes that the wound occurred yesterday, in the afternoon, approximately 15 hours ago, though she noticed that only overnight. Since onset she has a small area of discomfort in the right medial mid lateral thigh. No active bleeding, though there is avulsion of the superficial skin. Fever, nausea, vomiting. Patient is generally well, denies medical problems.  After she suffered the injury she called poison control, and with concern for brown recluse bite, she was sent here for evaluation. She denies any pain with muscle motion, again, any other current complaints. She does have a relative who had a substantial injury following a likely spider bite.   Past Medical History:  Diagnosis Date  . Hyperlipidemia     Patient Active Problem List   Diagnosis Date Noted  . Low back pain 02/25/2016  . Adjustment disorder with disturbance of emotion 03/01/2015  . Pain in breast 07/16/2012    Past Surgical History:  Procedure Laterality Date  . CESAREAN SECTION      OB History    Gravida Para Term Preterm AB Living   4 2 0 2 2 2    SAB TAB Ectopic Multiple Live Births   2               Home Medications    Prior to Admission medications   Medication Sig Start Date End Date Taking? Authorizing Provider  menthol-cetylpyridinium (CEPACOL) 3 MG lozenge Take 1 lozenge by mouth as needed for sore throat.    Historical Provider, MD  sulfamethoxazole-trimethoprim (BACTRIM DS,SEPTRA DS) 800-160 MG tablet Take 1 tablet by mouth 2 (two) times daily. 11/09/16 11/14/16  Gerhard Munchobert Tayjon Halladay, MD    Family History Family History  Problem Relation Age of Onset  . Hyperlipidemia Father     Social  History Social History  Substance Use Topics  . Smoking status: Never Smoker  . Smokeless tobacco: Never Used  . Alcohol use No     Allergies   Buscopan [scopolamine]   Review of Systems Review of Systems  Constitutional:       Per HPI, otherwise negative  HENT:       Per HPI, otherwise negative  Respiratory:       Per HPI, otherwise negative  Cardiovascular:       Per HPI, otherwise negative  Gastrointestinal: Negative for nausea and vomiting.  Endocrine:       Negative aside from HPI  Genitourinary:       Neg aside from HPI   Musculoskeletal:       Per HPI, otherwise negative  Skin: Positive for color change and wound.  Allergic/Immunologic: Negative for immunocompromised state.  Neurological: Negative for weakness.     Physical Exam Updated Vital Signs BP 130/61 (BP Location: Right Arm)   Pulse 63   Temp 97.7 F (36.5 C) (Oral)   Resp 18   SpO2 98%   Physical Exam  Constitutional: She is oriented to person, place, and time. She appears well-developed and well-nourished. No distress.  HENT:  Head: Normocephalic and atraumatic.  Eyes: Conjunctivae and EOM are normal.  Cardiovascular: Normal rate and regular rhythm.   Pulmonary/Chest: Effort normal and breath sounds normal.  No stridor. No respiratory distress.  Musculoskeletal: She exhibits no edema.       Right knee: Normal.  No pain with range of motion of the right leg, knee, hip, no appreciable surrounding induration, swelling, no interference with range of motion.  Neurological: She is alert and oriented to person, place, and time. No cranial nerve deficit.  Skin: Skin is warm and dry.     Psychiatric: She has a normal mood and affect.  Nursing note and vitals reviewed.    ED Treatments / Results   Procedures Procedures (including critical care time)  Medications Ordered in ED Medications  sulfamethoxazole-trimethoprim (BACTRIM DS,SEPTRA DS) 800-160 MG per tablet 1 tablet (not administered)      Initial Impression / Assessment and Plan / ED Course  I have reviewed the triage vital signs and the nursing notes.  Pertinent labs & imaging results that were available during my care of the patient were reviewed by me and considered in my medical decision making (see chart for details).  Well-appearing female presents with a cutaneous lesion. No evidence for deep space infection, no evidence for bacteremia, sepsis, and the absence of induration, limitation of range of motion, significant swelling suggests that this was not a brown recluse, or other dangerous spider bite. There is some evidence for early cellulitis.   Final Clinical Impressions(s) / ED Diagnoses   Final diagnoses:  Insect bite, initial encounter    New Prescriptions New Prescriptions   SULFAMETHOXAZOLE-TRIMETHOPRIM (BACTRIM DS,SEPTRA DS) 800-160 MG TABLET    Take 1 tablet by mouth 2 (two) times daily.     Gerhard Munch, MD 11/09/16 [phone]

## 2016-12-11 ENCOUNTER — Encounter: Payer: Self-pay | Admitting: Family Medicine

## 2016-12-11 ENCOUNTER — Ambulatory Visit (INDEPENDENT_AMBULATORY_CARE_PROVIDER_SITE_OTHER): Payer: 59 | Admitting: Family Medicine

## 2016-12-11 VITALS — BP 122/77 | HR 52 | Temp 98.4°F | Resp 18 | Ht 62.5 in | Wt 143.8 lb

## 2016-12-11 DIAGNOSIS — N644 Mastodynia: Secondary | ICD-10-CM | POA: Diagnosis not present

## 2016-12-11 NOTE — Progress Notes (Signed)
  Melody Perkins - 51 y.o. female MRN [id number]  Date of birth: March 22, 1966  SUBJECTIVE:  Including CC & ROS.  Chief Complaint  Patient presents with  . Breast Pain    right breast is hurting x2days    Ms. Melody Perkins is a 51 yo F that is presenting with right breast pain. The pain started for the past two days. She felt the pain while leaning over while working in the yard. Denies any nipple discharge. She feels like there is some retraction of the breast. The pain is on the midaxillary line and in the center of her breast. Her sisters had benign lesions from their breasts removed. Denies any trauma. No new medications. Last menstrual cycle was at 51 yo. The pain is 4/10. The pain is worse without her bra. Only pain without the bra.   ROS: No unexpected weight loss, fever, chills, swelling, instability, muscle pain, numbness/tingling, redness, otherwise see HPI   HISTORY: Past Medical, Surgical, Social, and Family History Reviewed & Updated per EMR.   Pertinent Historical Findings include: PMHx: history of breast pain  Surgical:   none Social:  No tobacco use, occasional alcohol use  FHx: HTN   PHYSICAL EXAM:  VS: BP 122/77   Pulse (!) 52   Temp 98.4 F (36.9 C) (Oral)   Resp 18   Ht 5' 2.5" (1.588 m)   Wt 143 lb 12.8 oz (65.2 kg)   SpO2 98%   BMI 25.88 kg/m  PHYSICAL EXAM: Gen: NAD, alert, cooperative with exam, well-appearing HEENT: NCAT, EOMI, clear conjunctiva, oropharynx clear, supple neck CV: good S1/S2, no murmur, no edema, capillary refill brisk  Resp: CTABL, no wheezes, non-labored Skin: no rashes, normal turgor  Neuro: no gross deficits.  Psych:  alert and oriented Breast: no abnormal nipple retraction or dimpling. Some tenderness to palpation just inferior to the nipple.  Some tenderness palpation in the mid axillary line   No nipple discharge with expression.  ASSESSMENT & PLAN:   Breast pain This pain is a fairly acute Since it is only been present for 2 days. Appears that  she has had a history of similar pain and a normal mammogram was completed last September. Unclear if this is originating from her chest wall as opposed her breast tissue. - Try over-the-counter anti-inflammatories - advised to follow-up and one week.  If there is no improvement could consider a diagnostic mammogram at that point.

## 2016-12-11 NOTE — Patient Instructions (Addendum)
Thank you for coming in,   Please try taking an anti-inflammatory on a daily basis for one week.   Please follow up if your pain persists and we can consider imaging at that time.    Please feel free to call with any questions or concerns at any time, at [phone]. --Dr. Jordan LikesSchmitz     IF you received an x-ray today, you will receive an invoice from [facility]. Please contact [facility] at [phone] with questions or concerns regarding your invoice.   IF you received labwork today, you will receive an invoice from [facility]. Please contact [facility] at [phone] with questions or concerns regarding your invoice.   Our billing staff will not be able to assist you with questions regarding bills from these companies.  You will be contacted with the lab results as soon as they are available. The fastest way to get your results is to activate your My Chart account. Instructions are located on the last page of this paperwork. If you have not heard from us regarding the results in 2 weeks, please contact this office.

## 2016-12-11 NOTE — Assessment & Plan Note (Signed)
This pain is a fairly acute Since it is only been present for 2 days. Appears that she has had a history of similar pain and a normal mammogram was completed last September. Unclear if this is originating from her chest wall as opposed her breast tissue. - Try over-the-counter anti-inflammatories - advised to follow-up and one week.  If there is no improvement could consider a diagnostic mammogram at that point.

## 2017-03-01 ENCOUNTER — Other Ambulatory Visit: Payer: Self-pay | Admitting: Medical

## 2017-03-01 DIAGNOSIS — Z1231 Encounter for screening mammogram for malignant neoplasm of breast: Secondary | ICD-10-CM

## 2017-03-05 ENCOUNTER — Ambulatory Visit: Payer: Self-pay | Admitting: Medical

## 2017-03-08 ENCOUNTER — Telehealth: Payer: Self-pay | Admitting: Medical

## 2017-03-08 ENCOUNTER — Encounter: Payer: Self-pay | Admitting: Medical

## 2017-03-08 ENCOUNTER — Ambulatory Visit (INDEPENDENT_AMBULATORY_CARE_PROVIDER_SITE_OTHER): Payer: 59 | Admitting: Medical

## 2017-03-08 VITALS — BP 116/64 | HR 58 | Temp 98.7°F | Resp 18 | Ht 61.0 in | Wt 148.0 lb

## 2017-03-08 DIAGNOSIS — E785 Hyperlipidemia, unspecified: Secondary | ICD-10-CM

## 2017-03-08 DIAGNOSIS — Z1211 Encounter for screening for malignant neoplasm of colon: Secondary | ICD-10-CM | POA: Diagnosis not present

## 2017-03-08 DIAGNOSIS — N63 Unspecified lump in unspecified breast: Secondary | ICD-10-CM

## 2017-03-08 DIAGNOSIS — K635 Polyp of colon: Secondary | ICD-10-CM

## 2017-03-08 NOTE — Patient Instructions (Addendum)
For your history of small breast lump present for 2 months we'll go ahead and order diagnostic mammogram.   For your history of high cholesterol, I recommended she go ahead and schedule complete physical exam. Labs will be done fasting. Also at that time we can assess your fatigue.  I went ahead and put in a order for screening colonoscopy. Place GI referral. Later during interview patient noted that she did have colonoscopy in the past and they found a polyp. So I replaced the referral and explain history of polyp. Will talk with referral staff and have her cancel the screening colonoscopy.  Follow-up in 2-3 weeks or as needed.

## 2017-03-08 NOTE — Progress Notes (Signed)
Subjective:    Patient ID: Melody Perkins, female    DOB: [birth year], 51 y.o.   MRN: [id number]  HPI  Works as Engineer, civil (consulting)nurse, she does not exercise regularly, no smoke, no alcohol, married- 3 children.  Pt in states she feels ok today. Little tired today. She has recently been working 12 hours shift 3 days in a row. Then rests 4 days. She thinks fatigue is related to work.   Pt states no recent labs for years. Maybe 2-3 years ago. In [place] had mild elevated cholesterol  Pt had good blood pressure.  Pt had some faint tenderness in rt breast pain.  She thought maybe related to bra. MD recommended nsaids and if did not improve consider mammogram. Last mammogram was negative. No family history of breast cancer.   Hx of benign sebacious cyst rt axillary area in 20's. Had removed.   Pt has appointment with gyn next week. Will have pap smear.    Review of Systems  Constitutional: Negative for chills, fatigue and fever.  HENT: Negative for congestion, drooling, ear pain and facial swelling.   Respiratory: Negative for chest tightness, shortness of breath and wheezing.   Cardiovascular: Negative for chest pain and palpitations.  Gastrointestinal: Negative for abdominal pain.  Genitourinary: Negative for difficulty urinating, dysuria, frequency and pelvic pain.  Musculoskeletal: Negative for back pain and gait problem.       See hpi and breast exam.  Skin: Negative for rash.  Neurological: Negative for dizziness, numbness and headaches.  Hematological: Negative for adenopathy. Does not bruise/bleed easily.  Psychiatric/Behavioral: Negative for behavioral problems, hallucinations and suicidal ideas. The patient is not nervous/anxious and is not hyperactive.     Past Medical History:  Diagnosis Date  . Hyperlipidemia      Social History   Social History  . Marital status: Married    Spouse name: N/A  . Number of children: N/A  . Years of education: N/A   Occupational History  . Not on  file.   Social History Main Topics  . Smoking status: Never Smoker  . Smokeless tobacco: Never Used  . Alcohol use No  . Drug use: No  . Sexual activity: Not on file   Other Topics Concern  . Not on file   Social History Narrative  . No narrative on file    Past Surgical History:  Procedure Laterality Date  . CESAREAN SECTION      Family History  Problem Relation Age of Onset  . Hyperlipidemia Father     Allergies  Allergen Reactions  . Buscopan [Scopolamine] Palpitations    No current outpatient prescriptions on file prior to visit.   No current facility-administered medications on file prior to visit.     BP 116/64   Pulse (!) 58   Temp 98.7 F (37.1 C) (Oral)   Resp 18   Ht 5\' 1"  (1.549 m)   Wt 148 lb (67.1 kg)   SpO2 99%   BMI 27.96 kg/m       Objective:   Physical Exam  General Mental Status- Alert. General Appearance- Not in acute distress.   Skin General: Color- Normal Color. Moisture- Normal Moisture.  Neck Carotid Arteries- Normal color. Moisture- Normal Moisture. No carotid bruits. No JVD.  Chest and Lung Exam Auscultation: Breath Sounds:-Normal.  Cardiovascular Auscultation:Rythm- Regular. Murmurs & Other Heart Sounds:Auscultation of the heart reveals- No Murmurs.  Abdomen Inspection:-Inspeection Normal. Palpation/Percussion:Note:No mass. Palpation and Percussion of the abdomen reveal- Non Tender, Non Distended +  BS, no rebound or guarding.   Neurologic Cranial Nerve exam:- CN III-XII intact(No nystagmus), symmetric smile. Drift Test:- No drift. Romberg Exam:- Negative.  Heal to Toe Gait exam:-Normal. Finger to Nose:- Normal/Intact Strength:- 5/5 equal and symmetric strength both upper and lower extremities.  Breast-exam done with chaperone. Breasts appear symmetric. Normal color. No discharge from either nipple. Axillary areas show no lymphadenopathy. Left breast feels normal with no lumps or masses. Right breast lateral  aspect at the junction of upper and lower quadrant slightly tender area with faintly palpable probable 1.5 cm lump. Patient explains she can feel  area. Slightly tender.      Assessment & Plan:  For your history of small breast lump present for 2 months we'll go ahead and order diagnostic mammogram.   For your history of high cholesterol, I recommended she go ahead and schedule complete physical exam. Labs will be done fasting. Also at that time we can assess your fatigue.  I went ahead and put in a order for screening colonoscopy. Place GI referral.So I replaced the referral and explain history of polyp. Will talk with referral staff and have her cancel the screening colonoscopy.  Follow-up in 2-3 weeks or as needed.  [place], Ramon Dredge, PA-C

## 2017-03-08 NOTE — Telephone Encounter (Signed)
Referral to GI placed for history of polyp.

## 2017-03-09 ENCOUNTER — Telehealth: Payer: Self-pay | Admitting: Medical

## 2017-03-09 ENCOUNTER — Other Ambulatory Visit (INDEPENDENT_AMBULATORY_CARE_PROVIDER_SITE_OTHER): Payer: 59

## 2017-03-09 DIAGNOSIS — E785 Hyperlipidemia, unspecified: Secondary | ICD-10-CM

## 2017-03-09 LAB — LIPID PANEL
CHOL/HDL RATIO: 4
Cholesterol: 263 mg/dL — ABNORMAL HIGH (ref 0–200)
HDL: 64.7 mg/dL (ref >39.00)
LDL Cholesterol: 185 mg/dL — ABNORMAL HIGH (ref 0–99)
NonHDL: 198.54
TRIGLYCERIDES: 66 mg/dL (ref 0.0–149.0)
VLDL: 13.2 mg/dL (ref 0.0–40.0)

## 2017-03-09 NOTE — Telephone Encounter (Signed)
Cholesterol

## 2017-03-12 ENCOUNTER — Encounter: Payer: Self-pay | Admitting: Medical

## 2017-03-12 ENCOUNTER — Telehealth: Payer: Self-pay | Admitting: Medical

## 2017-03-12 MED ORDER — ROSUVASTATIN CALCIUM 10 MG PO TABS: 10.0000 mg | ORAL_TABLET | Freq: Every day | ORAL | 3 refills | Status: DC

## 2017-03-12 NOTE — Telephone Encounter (Signed)
Prescription of Crestor sent to patient's pharmacy

## 2017-03-13 ENCOUNTER — Other Ambulatory Visit: Payer: Self-pay | Admitting: Medical

## 2017-03-13 DIAGNOSIS — N63 Unspecified lump in unspecified breast: Secondary | ICD-10-CM

## 2017-03-14 ENCOUNTER — Telehealth: Payer: Self-pay | Admitting: Medical

## 2017-03-14 NOTE — Telephone Encounter (Signed)
LVM (cell phone) for pt to return call to inform her the below.

## 2017-03-14 NOTE — Telephone Encounter (Signed)
-----   Message from Melody RichtersEdward Saguier, PA-C sent at 03/12/2017  8:44 PM EDT ----- Patient does have high cholesterol. Total cholesterol of 263 and LDL of 185. She does admit history of this in the past. I would recommend diet and exercise and start low-dose Crestor. We'll send a prescription to the pharmacy and she can repeat the lipid panel in 3 months fasting.

## 2017-03-16 ENCOUNTER — Other Ambulatory Visit: Payer: Self-pay | Admitting: Medical

## 2017-03-16 ENCOUNTER — Ambulatory Visit
Admission: RE | Admit: 2017-03-16 | Discharge: 2017-03-16 | Disposition: A | Discharge status: Home / Self Care | Payer: 59 | Source: Ambulatory Visit | Attending: Medical | Admitting: Medical

## 2017-03-16 ENCOUNTER — Ambulatory Visit
Admission: RE | Admit: 2017-03-16 | Discharge: 2017-03-16 | Disposition: A | Discharge status: Home / Self Care | Payer: Self-pay | Source: Ambulatory Visit | Attending: Medical | Admitting: Medical

## 2017-03-16 DIAGNOSIS — R928 Other abnormal and inconclusive findings on diagnostic imaging of breast: Secondary | ICD-10-CM | POA: Diagnosis not present

## 2017-03-16 DIAGNOSIS — N63 Unspecified lump in unspecified breast: Secondary | ICD-10-CM

## 2017-03-16 DIAGNOSIS — N6489 Other specified disorders of breast: Secondary | ICD-10-CM | POA: Diagnosis not present

## 2017-03-16 NOTE — Telephone Encounter (Signed)
Spoke with pt and explained the below, pt states does not want to take meds Crestor (pt read information about it and does not want this since it has some effect on prancreas)  and would like provider to cancel her rx that were sent to pharmacy, pt would like more information about diet and exercising that she can do for cholesterol. Please advise pt regarding exercising and diet. Pt tel  [phone].

## 2017-03-17 NOTE — Telephone Encounter (Signed)
Will you advise pt will discuss benefits and risk of cholesterol med on follow up. Can try diet and exercise and not take medication. She has a appointment for complete physical coming up soon? Will try to find diet info in epic on day she is in for physical.  When is her next appointment.

## 2017-03-19 ENCOUNTER — Encounter: Payer: Self-pay | Admitting: Medical

## 2017-03-19 NOTE — Telephone Encounter (Signed)
VM full and could not leave message.

## 2017-03-21 ENCOUNTER — Ambulatory Visit (INDEPENDENT_AMBULATORY_CARE_PROVIDER_SITE_OTHER): Payer: 59 | Admitting: Medical

## 2017-03-21 ENCOUNTER — Encounter: Payer: 59 | Admitting: Obstetrics & Gynecology

## 2017-03-21 ENCOUNTER — Ambulatory Visit: Payer: Self-pay | Admitting: Medical

## 2017-03-21 ENCOUNTER — Encounter: Payer: Self-pay | Admitting: Obstetrics & Gynecology

## 2017-03-21 ENCOUNTER — Encounter: Payer: Self-pay | Admitting: Medical

## 2017-03-21 VITALS — BP 123/81 | HR 66 | Temp 98.1°F | Ht 61.0 in | Wt 146.0 lb

## 2017-03-21 DIAGNOSIS — R739 Hyperglycemia, unspecified: Secondary | ICD-10-CM

## 2017-03-21 DIAGNOSIS — E785 Hyperlipidemia, unspecified: Secondary | ICD-10-CM

## 2017-03-21 NOTE — Progress Notes (Signed)
Subjective:    Patient ID: Melody Perkins, female    DOB: 1966/02/29, 51 y.o.   MRN: [id number]  HPI  Pt in for follow up.  Pt has high cholesterol at level of total cholesterol 263 and ldl of 185. She is concerned of potential side effectsof statin type medication.  Pt dad had mi at age 40. No other family members with heart attack or stroke.  Pt has no htn or diabetes.  She wants to try diet first. Pt states in past cholesterol dropped with exercise.  Pt has mild sugar elevation in recent past.   Review of Systems  Constitutional: Negative for chills, fatigue and fever.  HENT: Negative for congestion, drooling, ear pain and facial swelling.   Respiratory: Negative for cough, chest tightness, shortness of breath and wheezing.   Cardiovascular: Negative for chest pain and palpitations.  Gastrointestinal: Negative for abdominal pain, blood in stool, constipation, diarrhea, rectal pain and vomiting.  Genitourinary: Negative for dyspareunia, dysuria, hematuria, urgency and vaginal pain.  Musculoskeletal: Negative for back pain, myalgias and neck stiffness.  Skin: Negative for pallor and rash.  Neurological: Negative for dizziness, speech difficulty, weakness, light-headedness and headaches.  Hematological: Negative for adenopathy. Does not bruise/bleed easily.  Psychiatric/Behavioral: Negative for behavioral problems, confusion, hallucinations, sleep disturbance and suicidal ideas. The patient is not nervous/anxious.     Past Medical History:  Diagnosis Date  . Hyperlipidemia      Social History   Social History  . Marital status: Married    Spouse name: N/A  . Number of children: N/A  . Years of education: N/A   Occupational History  . Not on file.   Social History Main Topics  . Smoking status: Never Smoker  . Smokeless tobacco: Never Used  . Alcohol use No  . Drug use: No  . Sexual activity: Yes   Other Topics Concern  . Not on file   Social History Narrative  .  No narrative on file    Past Surgical History:  Procedure Laterality Date  . CESAREAN SECTION    . Right axillary cyst removed Right     Family History  Problem Relation Age of Onset  . Hyperlipidemia Father     Allergies  Allergen Reactions  . Buscopan [Scopolamine] Palpitations    Current Outpatient Prescriptions on File Prior to Visit  Medication Sig Dispense Refill  . rosuvastatin (CRESTOR) 10 MG tablet Take 1 tablet (10 mg total) by mouth daily. (Patient not taking: Reported on 03/21/2017) 30 tablet 3   No current facility-administered medications on file prior to visit.     BP 123/81   Pulse 66   Temp 98.1 F (36.7 C) (Oral)   Ht  (1.549 m)   Wt 146 lb (66.2 kg)   SpO2 98%   BMI 27.59 kg/m       Objective:   Physical Exam   General Mental Status- Alert. General Appearance- Not in acute distress.   Skin General: Color- Normal Color. Moisture- Normal Moisture.  Neck Carotid Arteries- Normal color. Moisture- Normal Moisture. No carotid bruits. No JVD.  Chest and Lung Exam Auscultation: Breath Sounds:-Normal.  Cardiovascular Auscultation:Rythm- Regular. Murmurs & Other Heart Sounds:Auscultation of the heart reveals- No Murmurs.  Abdomen Inspection:-Inspeection Normal. Palpation/Percussion:Note:No mass. Palpation and Percussion of the abdomen reveal- Non Tender, Non Distended + BS, no rebound or guarding.    Neurologic Cranial Nerve exam:- CN III-XII intact(No nystagmus), symmetric smile. Strength:- 5/5 equal and symmetric strength both upper and  lower extremities.     Assessment & Plan:  For your high cholesterol, I want you to be very strict on diet and exercise as you report that this has helped in the past. If on recheck your cholesterol is not improved then I think low-dose statin would be reasonable. We do need to consider risk versus benefits medication. But at the same time need to consider risk of uncontrolled high  cholesterol.  For mild high sugar levels in the past, will get CMP today as well as A1c. A1c will let us know your three-month blood sugar average.  Follow-up in 3 months or as needed.  [place], Ramon DredgeEdward, PA-C

## 2017-03-21 NOTE — Patient Instructions (Addendum)
For your high cholesterol, I want you to be very strict on diet and exercise as you report that this has helped in the past. If on recheck your cholesterol is not improved then I think low-dose statin would be reasonable. We do need to consider risk versus benefits medication. But at the same time need to consider risk of uncontrolled high cholesterol.  For mild high sugar levels in the past, will get CMP today as well as A1c. A1c will let us know your three-month blood sugar average.  Follow-up in 3 months or as needed.   Dieta restringida en grasas y colesterol (Fat and Cholesterol Restricted Diet) El exceso de grasas y colesterol en la dieta puede causar problemas de salud. Esta dieta lo ayudar a Pharmacologistmantener las grasas y Print production plannerel colesterol en los niveles normales para evitar enfermarse. QU TIPOS DE GRASAS DEBO ELEGIR?  Elija grasas monosaturadas y polinsaturadas. Estas se encuentran en alimentos como el aceite de oliva, aceite de canola, semillas de lino, nueces, almendras y semillas.  Consuma ms grasas omega-3. Las mejores opciones incluyen salmn, caballa, sardinas, atn, aceite de lino y semillas de lino molidas.  Limite el consumo de grasas saturadas, que se encuentran en productos de origen animal, como carnes, mantequilla y crema. Tambin pueden estar en productos vegetales, como aceite de palma, de palmiste y de coco.  Evite los alimentos con aceites parcialmente hidrogenados. Estos contienen grasas trans. Entre los ejemplos de alimentos con grasas trans se incluyen margarinas en barra, algunas margarinas untables, galletas dulces o saladas y otros productos horneados. QU PAUTAS GENERALES DEBO SEGUIR?  Lea las etiquetas de los alimentos. Busque las palabras "grasas trans" y "grasas saturadas".  Al preparar una comida: ? Llene la mitad del plato con verduras y ensaladas de hojas verdes. ? Llene un cuarto del plato con cereales integrales. Busque la palabra "integral" en Publishing rights managerel primer  lugar de la lista de ingredientes. ? Llene un cuarto del plato con alimentos con protenas magras.  Ingiera alimentos con ms fibra, como Keystonemanzanas, Center Hillzanahorias, frijoles, guisantes y Qatarcebada.  Coma ms comidas caseras. Coma menos en los restaurantes y los bares.  Limite o evite el alcohol.  Limite los alimentos con alto contenido de almidn y International aid/development workerazcar.  Limite el consumo de alimentos fritos.  Cocine los alimentos sin frerlos. Las opciones de coccin ms Panamaadecuadas son Development worker, communityhornear, Regulatory affairs officerhervir, Software engineergrillar y asar a Patent attorneyla parrilla.  Baje de peso si es necesario. Aunque pierda Marshall & Ilsleypoco peso, esto puede ser importante para la salud general. Tambin puede ayudar a prevenir enfermedades como diabetes y enfermedad cardaca. QU ALIMENTOS PUEDO COMER? Cereales Cereales integrales, como los panes de salvado o Cottondaleintegrales, las Bridgeportgalletas, los cereales y las pastas. Avena sin endulzar, trigo, Qatarcebada, quinua o arroz integral. Tortillas de harina de maz o de salvado. Verduras Verduras frescas o congeladas (crudas, al vapor, asadas o grilladas). Ensaladas de hojas verdes. Nils PyleFrutas Nils PyleFrutas frescas, en conserva (en su jugo natural) o frutas congeladas. Carnes y otros productos con protenas Carne de res molida (al 85% o ms San Marinomagra), carne de res de animales alimentados con pastos o carne de res sin la grasa. Pollo o pavo sin piel. Carne de pollo o de Brycelandpavo molida. Cerdo sin la grasa. Todos los pescados y frutos de mar. Huevos. Porotos, guisantes o lentejas secos. Frutos secos o semillas sin sal. Frijoles secos o en lata sin sal. Lcteos Productos lcteos con bajo contenido de grasas, como leche descremada o al 1%, quesos reducidos en grasas o al 2%, ricota  con bajo contenido de grasas o [employer], o yogur natural con bajo contenido de [place]. Grasas y [employer] untables que no contengan grasas trans. Mayonesa y condimentos para ensaladas livianos o reducidos en grasas. [place]. Aceites de oliva, canola, ssamo o  crtamo. Mantequilla natural de cacahuate o almendra (elija la que no tenga agregado de aceite o azcar). Los artculos mencionados arriba pueden no ser [employer] de las bebidas o los alimentos recomendados. Comunquese con el nutricionista para conocer ms opciones. QU ALIMENTOS NO SE RECOMIENDAN? Cereales Pan blanco. Pastas blancas. Arroz blanco. Pan de maz. Bagels, pasteles y croissants. Galletas saladas que contengan grasas trans. Verduras Papas blancas. MazHoover Brunette con crema o fritas. Verduras en salsa de Botines. Nils Pyle Frutas secas. Fruta enlatada en almbar liviano o espeso. Jugo de frutas. Carnes y otros productos con protenas Cortes de carne con Holiday representative. Costillas, alas de pollo, tocineta, salchicha, mortadela, salame, chinchulines, tocino, perros calientes, salchichas alemanas y embutidos envasados. Hgado y otros rganos. Lcteos Leche entera o al 2%, crema, mezcla de [place] y crema, y queso crema. Quesos enteros. Yogur entero o endulzado. Quesos con toda su grasa. Cremas no lcteas y coberturas batidas. Quesos procesados, quesos para untar o cuajadas. Dulces y postres Jarabe de maz, azcares, miel y Radio broadcast assistant. Caramelos. Mermelada y [place]. Doreen Beam. Cereales endulzados. Galletas, pasteles, bizcochuelos, donas, muffins y helado. Grasas y [address], [place] en barra, [place] de [place], [place], [place] clarificada o grasa de tocino. Aceites de coco, de palmiste o de palma. Bebidas Alcohol. Bebidas endulzadas (como refrescos, limonadas y bebidas frutales o ponches). Los artculos mencionados arriba pueden no ser [employer] de las bebidas y los alimentos que se Theatre stage manager. Comunquese con el nutricionista para obtener ms informacin. Esta informacin no tiene Theme park manager el consejo del mdico. Asegrese de hacerle al mdico cualquier pregunta que tenga. Document Released: 12/26/2011 Document Revised: 07/17/2014 Document Reviewed:  09/25/2013 Elsevier Interactive Patient Education  [employer].

## 2017-03-22 ENCOUNTER — Telehealth: Payer: Self-pay | Admitting: Medical

## 2017-03-22 LAB — COMPREHENSIVE METABOLIC PANEL
ALT: 14 U/L (ref 0–35)
AST: 17 U/L (ref 0–37)
Albumin: 4.4 g/dL (ref 3.5–5.2)
Alkaline Phosphatase: 70 U/L (ref 39–117)
BUN: 11 mg/dL (ref 6–23)
CHLORIDE: 107 meq/L (ref 96–112)
CO2: 29 meq/L (ref 19–32)
Calcium: 9.2 mg/dL (ref 8.4–10.5)
Creatinine, Ser: 0.55 mg/dL (ref 0.40–1.20)
GFR: 122.87 mL/min (ref >60.00)
GLUCOSE: 94 mg/dL (ref 70–99)
POTASSIUM: 3.5 meq/L (ref 3.5–5.1)
Sodium: 142 mEq/L (ref 135–145)
Total Bilirubin: 0.5 mg/dL (ref 0.2–1.2)
Total Protein: 7 g/dL (ref 6.0–8.3)

## 2017-03-22 LAB — HEMOGLOBIN A1C: Hgb A1c MFr Bld: 5.5 % (ref 4.6–6.5)

## 2017-03-22 NOTE — Telephone Encounter (Signed)
Pt was called and stated that had to go to work and wanted her lab results to be sent by mail. Please advise.

## 2017-03-22 NOTE — Telephone Encounter (Signed)
-----   Message from Esperanza RichtersEdward Saguier, PA-C sent at 03/22/2017 12:23 PM EDT ----- Patient's liver enzymes are normal and her kidney function is very good. Her three-month sugar average shows that she is not in the diabetic range. Please notify patient.

## 2017-03-22 NOTE — Telephone Encounter (Signed)
Mailed Lab results per patient request.

## 2017-03-23 ENCOUNTER — Encounter: Payer: Self-pay | Admitting: Family Medicine

## 2017-03-23 NOTE — Progress Notes (Signed)
Pt not seen by provider.

## 2017-03-28 ENCOUNTER — Telehealth: Payer: Self-pay | Admitting: Medical

## 2017-03-28 NOTE — Telephone Encounter (Signed)
Would you call patient and ask her when she was advised by radiology to repeat her mammogram. We'll let me know what she says.

## 2017-04-03 ENCOUNTER — Ambulatory Visit (HOSPITAL_BASED_OUTPATIENT_CLINIC_OR_DEPARTMENT_OTHER): Payer: Self-pay

## 2017-05-04 ENCOUNTER — Ambulatory Visit (INDEPENDENT_AMBULATORY_CARE_PROVIDER_SITE_OTHER): Payer: 59 | Admitting: Obstetrics & Gynecology

## 2017-05-04 ENCOUNTER — Encounter: Payer: Self-pay | Admitting: Obstetrics & Gynecology

## 2017-05-04 VITALS — BP 109/74 | HR 61 | Ht 61.0 in | Wt 147.0 lb

## 2017-05-04 DIAGNOSIS — Z1151 Encounter for screening for human papillomavirus (HPV): Secondary | ICD-10-CM | POA: Diagnosis not present

## 2017-05-04 DIAGNOSIS — N898 Other specified noninflammatory disorders of vagina: Secondary | ICD-10-CM

## 2017-05-04 DIAGNOSIS — Z01419 Encounter for gynecological examination (general) (routine) without abnormal findings: Secondary | ICD-10-CM | POA: Diagnosis not present

## 2017-05-04 DIAGNOSIS — Z1239 Encounter for other screening for malignant neoplasm of breast: Secondary | ICD-10-CM

## 2017-05-04 DIAGNOSIS — Z124 Encounter for screening for malignant neoplasm of cervix: Secondary | ICD-10-CM | POA: Diagnosis not present

## 2017-05-04 NOTE — Progress Notes (Signed)
Pt states that she is having d/c and would like checked today. Denies any itching or irritation.  Last pap 04/2016-WNL.

## 2017-05-04 NOTE — Addendum Note (Signed)
Addended by: Marya LandryFOSTER, Cortlandt Capuano D on: 05/04/2017 10:30 AM   Modules accepted: Orders

## 2017-05-04 NOTE — Progress Notes (Signed)
Subjective:     Melody Perkins is a 51 y.o. female here for a routine exam.  LMP 05/2012. [id number] Pt breast fed for 1 1/2 months for each child. Current complaints: Pt reports a discharge. It has been present for 2 months. The discharge does not have an odor. It is not assoc with itching. She is not currently sexually active with spouse due to is treatment for prostate cancer. Last sexually active 8-9 months prev. Prev had breast pain in June that has resolved. She was seen for that and eval.     Pt denies FH of breast cancer. Pt denies FH or ov cancer.  Her mother dies at age 53 years.    Gynecologic History No LMP recorded. Patient is not currently having periods (Reason: Perimenopausal). Contraception: post menopausal status Last Pap: 04/26/2016. Results were: normal Last mammogram: 03/2016. Results were: normal  Obstetric History OB History  Gravida Para Term Preterm AB Living  4 2 0 2 2 2   SAB TAB Ectopic Multiple Live Births  2            # Outcome Date GA Lbr Len/2nd Weight Sex Delivery Anes PTL Lv  4 Preterm           3 Preterm           2 SAB           1 SAB                The following portions of the patient's history were reviewed and updated as appropriate: allergies, current medications, past family history, past medical history, past social history, past surgical history and problem list.  Review of Systems Pertinent items are noted in HPI.    Objective:  BP 109/74   Pulse 61   Ht 5\' 1"  (1.549 m)   Wt 147 lb (66.7 kg)   BMI 27.78 kg/m   General Appearance:    Alert, cooperative, no distress, appears stated age  Head:    Normocephalic, without obvious abnormality, atraumatic  Eyes:    conjunctiva/corneas clear, EOM's intact, both eyes  Ears:    Normal external ear canals, both ears  Nose:   Nares normal, septum midline, mucosa normal, no drainage    or sinus tenderness  Throat:   Lips, mucosa, and tongue normal; teeth and gums normal  Neck:   Supple,  symmetrical, trachea midline, no adenopathy;    thyroid:  no enlargement/tenderness/nodules  Back:     Symmetric, no curvature, ROM normal, no CVA tenderness  Lungs:     Clear to auscultation bilaterally, respirations unlabored  Chest Wall:    No tenderness or deformity   Heart:    Regular rate and rhythm, S1 and S2 normal, no murmur, rub   or gallop  Breast Exam:    No tenderness, masses, or nipple abnormality  Abdomen:     Soft, non-tender, bowel sounds active all four quadrants,    no masses, no organomegaly  Genitalia:    Normal female without lesion, discharge or tenderness     Extremities:   Extremities normal, atraumatic, no cyanosis or edema  Pulses:   2+ and symmetric all extremities  Skin:   Skin color, texture, turgor normal, no rashes or lesions     Assessment:    Healthy female exam.   Vaginal discharge- normal on exam Cervical cancer screening- Pt is worried and wants a repeat PAP this year.    Plan:    Follow up in:  1 year.    Mammogram ordered F/u PAP with hrHPV  F/u Affirm for discharge  Kahner Yanik L. Harraway-Smith, M.D., [place]

## 2017-05-04 NOTE — Patient Instructions (Signed)

## 2017-05-07 ENCOUNTER — Other Ambulatory Visit: Payer: Self-pay | Admitting: Obstetrics & Gynecology

## 2017-05-07 DIAGNOSIS — B9689 Other specified bacterial agents as the cause of diseases classified elsewhere: Secondary | ICD-10-CM

## 2017-05-07 DIAGNOSIS — N760 Acute vaginitis: Principal | ICD-10-CM

## 2017-05-07 LAB — CERVICOVAGINAL ANCILLARY ONLY: Wet Prep (BD Affirm): POSITIVE — AB

## 2017-05-07 MED ORDER — METRONIDAZOLE 500 MG PO TABS: 500.0000 mg | ORAL_TABLET | Freq: Two times a day (BID) | ORAL | 0 refills | Status: DC

## 2017-05-08 ENCOUNTER — Telehealth: Payer: Self-pay

## 2017-05-08 LAB — CYTOLOGY - PAP
DIAGNOSIS: NEGATIVE
HPV (WINDOPATH): NOT DETECTED

## 2017-05-08 NOTE — Telephone Encounter (Signed)
Called patient to make her aware of BV and that Dr. Erin Fullingharraway Smith has sent in Flagyl for her to her pharmacy.  Unable to leave message because mailbox full. Armandina StammerJennifer Howard RNBSN

## 2017-05-08 NOTE — Telephone Encounter (Signed)
Patient returned call to office and made aware of Bacterial vaginosis and that prescription is at her pharmacy. Armandina StammerJennifer Dhrithi Riche RNBSN

## 2017-05-16 ENCOUNTER — Telehealth: Payer: Self-pay

## 2017-05-16 DIAGNOSIS — N760 Acute vaginitis: Principal | ICD-10-CM

## 2017-05-16 DIAGNOSIS — B9689 Other specified bacterial agents as the cause of diseases classified elsewhere: Secondary | ICD-10-CM

## 2017-05-16 MED ORDER — METRONIDAZOLE 500 MG PO TABS: 500.0000 mg | ORAL_TABLET | Freq: Two times a day (BID) | ORAL | 0 refills | Status: DC

## 2017-05-16 NOTE — Telephone Encounter (Signed)
Patient stopped by the office to say she never picked up her prescription for Flagyl and now needs the script recalled in. [facility] [address] Armandina StammerJennifer Howard RNBSN

## 2017-05-17 ENCOUNTER — Ambulatory Visit (HOSPITAL_BASED_OUTPATIENT_CLINIC_OR_DEPARTMENT_OTHER): Payer: Self-pay

## 2017-05-21 ENCOUNTER — Ambulatory Visit (HOSPITAL_BASED_OUTPATIENT_CLINIC_OR_DEPARTMENT_OTHER)
Admission: RE | Admit: 2017-05-21 | Discharge: 2017-05-21 | Disposition: A | Discharge status: Home / Self Care | Payer: 59 | Source: Ambulatory Visit | Attending: Obstetrics & Gynecology | Admitting: Obstetrics & Gynecology

## 2017-05-21 ENCOUNTER — Other Ambulatory Visit: Payer: Self-pay | Admitting: Obstetrics & Gynecology

## 2017-05-21 DIAGNOSIS — Z1239 Encounter for other screening for malignant neoplasm of breast: Secondary | ICD-10-CM

## 2017-05-21 DIAGNOSIS — Z1231 Encounter for screening mammogram for malignant neoplasm of breast: Secondary | ICD-10-CM | POA: Diagnosis not present

## 2017-06-18 ENCOUNTER — Ambulatory Visit: Payer: Self-pay | Admitting: Medical

## 2017-08-13 ENCOUNTER — Ambulatory Visit (INDEPENDENT_AMBULATORY_CARE_PROVIDER_SITE_OTHER): Payer: 59 | Admitting: Orthopaedic Surgery

## 2017-08-16 ENCOUNTER — Ambulatory Visit (INDEPENDENT_AMBULATORY_CARE_PROVIDER_SITE_OTHER): Payer: 59 | Admitting: Orthopaedic Surgery

## 2017-09-03 ENCOUNTER — Encounter (INDEPENDENT_AMBULATORY_CARE_PROVIDER_SITE_OTHER): Payer: Self-pay | Admitting: Orthopaedic Surgery

## 2017-09-03 ENCOUNTER — Ambulatory Visit (INDEPENDENT_AMBULATORY_CARE_PROVIDER_SITE_OTHER): Payer: 59 | Admitting: Orthopaedic Surgery

## 2017-09-03 ENCOUNTER — Ambulatory Visit (INDEPENDENT_AMBULATORY_CARE_PROVIDER_SITE_OTHER): Payer: 59

## 2017-09-03 DIAGNOSIS — M79675 Pain in left toe(s): Secondary | ICD-10-CM

## 2017-09-03 DIAGNOSIS — M65341 Trigger finger, right ring finger: Secondary | ICD-10-CM | POA: Diagnosis not present

## 2017-09-03 NOTE — Progress Notes (Signed)
Office Visit Note   Patient: Melody Perkins           Date of Birth: 1966/02/22           MRN: [id number] Visit Date: 09/03/2017              Requested by: Esperanza RichtersSaguier, Edward, PA-C [address] [place], [place] [id number] PCP: Esperanza RichtersSaguier, Edward, PA-C   Assessment & Plan: Visit Diagnoses:  1. Toe pain, left   2. Trigger finger, right ring finger     Plan: Impression is right ring trigger finger.  At this point, we have recommended cortisone injection.  She does not want to proceed until she can figure out which finger she has had surgical intervention.  She will obtain those records today and follow-up with us tomorrow.  In regards to the small toe, we recommend wearing shoes with a wider toe box.  Follow-Up Instructions: Return in about 1 day (around 09/04/2017).   Orders:  Orders Placed This Encounter  Procedures  . XR Toe 4th Left   No orders of the defined types were placed in this encounter.     Procedures: No procedures performed   Clinical Data: No additional findings.   Subjective: Chief Complaint  Patient presents with  . Left 4th Toe - Pain  . Right Ring Finger - Pain    HPI Melody Perkins is a 52 year old new patient who presents to our clinic today with triggering to the right ring finger as well as left small toe pain.  In regards to the small toe, this is been ongoing for the past 1 year.  No known injury.  She only gets the pain when she is wearing pointy shoes.  No numbness tingling burning.  In regards to the right ring finger, she has been having pain and triggering over the past 2-3 months.  This is progressively worsened.  This is worse in the morning.  No numbness tingling burning.  She does note trigger finger release back in 2011 in [place] but is unsure which finger.  We do not have any correspondence for this.  Review of Systems as detailed in HPI.  All others reviewed and are negative.   Objective: Vital Signs: There were no vitals taken for  this visit.  Physical Exam well-developed well-nourished female no acute distress.  Alert and oriented x3.  Ortho Exam examination of her right ring finger reveals marked triggering.  She does not have much of a palpable nodule but she is tender over the A1 pulley.  Examination of her left toe does not exhibit tenderness.  Full range of motion.  Specialty Comments:  No specialty comments available.  Imaging: Xr Toe 4th Left  Result Date: 09/03/2017 X-rays of the small toe are negative for structural abnormalities    PMFS History: Patient Active Problem List   Diagnosis Date Noted  . Toe pain, left 09/03/2017  . Trigger finger, right ring finger 09/03/2017  . Low back pain 02/25/2016  . Adjustment disorder with disturbance of emotion 03/01/2015  . Breast pain 07/16/2012   Past Medical History:  Diagnosis Date  . Hyperlipidemia     Family History  Problem Relation Age of Onset  . Hyperlipidemia Father     Past Surgical History:  Procedure Laterality Date  . CESAREAN SECTION    . Right axillary cyst removed Right    Social History   Occupational History  . Not on file  Tobacco Use  . Smoking status:  Never Smoker  . Smokeless tobacco: Never Used  Substance and Sexual Activity  . Alcohol use: Yes    Comment: occ  . Drug use: No  . Sexual activity: Yes    Birth control/protection: None

## 2017-09-04 ENCOUNTER — Ambulatory Visit (INDEPENDENT_AMBULATORY_CARE_PROVIDER_SITE_OTHER): Payer: 59 | Admitting: Orthopaedic Surgery

## 2017-09-22 ENCOUNTER — Emergency Department (HOSPITAL_COMMUNITY)
Admission: EM | Admit: 2017-09-22 | Discharge: 2017-09-22 | Disposition: A | Discharge status: Home / Self Care | Payer: PRIVATE HEALTH INSURANCE | Attending: Emergency Medicine | Admitting: Emergency Medicine

## 2017-09-22 ENCOUNTER — Emergency Department (HOSPITAL_COMMUNITY): Payer: PRIVATE HEALTH INSURANCE

## 2017-09-22 ENCOUNTER — Encounter (HOSPITAL_COMMUNITY): Payer: Self-pay | Admitting: Emergency Medicine

## 2017-09-22 DIAGNOSIS — M5442 Lumbago with sciatica, left side: Secondary | ICD-10-CM | POA: Diagnosis not present

## 2017-09-22 DIAGNOSIS — X500XXA Overexertion from strenuous movement or load, initial encounter: Secondary | ICD-10-CM | POA: Insufficient documentation

## 2017-09-22 DIAGNOSIS — Y9389 Activity, other specified: Secondary | ICD-10-CM | POA: Insufficient documentation

## 2017-09-22 DIAGNOSIS — M545 Low back pain: Secondary | ICD-10-CM | POA: Diagnosis present

## 2017-09-22 DIAGNOSIS — M5431 Sciatica, right side: Secondary | ICD-10-CM

## 2017-09-22 DIAGNOSIS — Y990 Civilian activity done for income or pay: Secondary | ICD-10-CM | POA: Diagnosis not present

## 2017-09-22 DIAGNOSIS — Y92239 Unspecified place in hospital as the place of occurrence of the external cause: Secondary | ICD-10-CM | POA: Insufficient documentation

## 2017-09-22 DIAGNOSIS — M5441 Lumbago with sciatica, right side: Secondary | ICD-10-CM | POA: Diagnosis not present

## 2017-09-22 MED ORDER — KETOROLAC TROMETHAMINE 30 MG/ML IJ SOLN
60.0000 mg | Freq: Once | INTRAMUSCULAR | Status: AC
  Administered 2017-09-22: 60 mg via INTRAMUSCULAR
  Filled 2017-09-22: qty 2

## 2017-09-22 MED ORDER — IBUPROFEN 600 MG PO TABS: 600.0000 mg | ORAL_TABLET | Freq: Four times a day (QID) | ORAL | 0 refills | Status: DC | PRN

## 2017-09-22 NOTE — ED Notes (Signed)
Bed: RESA Expected date:  Expected time:  Means of arrival:  Comments: Fall

## 2017-09-22 NOTE — ED Provider Notes (Signed)
[facility]-EMERGENCY DEPT Provider Note   CSN: [id number] Arrival date & time: 09/22/17  1135     History   Chief Complaint Chief Complaint  Patient presents with  . Back Pain    HPI Melody Perkins is a 52 y.o. female.  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  No other injuries.  Previous remote episode of back pain, but not for a long length of time.  She takes ibuprofen for pain.  Severity is mild.  Bending over makes pain worse.      Past Medical History:  Diagnosis Date  . Hyperlipidemia     Patient Active Problem List   Diagnosis Date Noted  . Toe pain, left 09/03/2017  . Trigger finger, right ring finger 09/03/2017  . Low back pain 02/25/2016  . Adjustment disorder with disturbance of emotion 03/01/2015  . Breast pain 07/16/2012    Past Surgical History:  Procedure Laterality Date  . CESAREAN SECTION    . Right axillary cyst removed Right     OB History    Gravida Para Term Preterm AB Living   4 2 0 2 2 2    SAB TAB Ectopic Multiple Live Births   2               Home Medications    Prior to Admission medications   Medication Sig Start Date End Date Taking? Authorizing Provider  ibuprofen (ADVIL,MOTRIN) 600 MG tablet Take 1 tablet (600 mg total) by mouth every 6 (six) hours as needed. 09/22/17   Donnetta Hutching, MD  metroNIDAZOLE (FLAGYL) 500 MG tablet Take 1 tablet (500 mg total) 2 (two) times daily by mouth. Patient not taking: Reported on 09/22/2017 05/16/17   Willodean Rosenthal, MD    Family History Family History  Problem Relation Age of Onset  . Hyperlipidemia Father     Social History Social History   Tobacco Use  . Smoking status: Never Smoker  . Smokeless tobacco: Never Used  Substance Use Topics  . Alcohol use: Yes    Comment: occ  . Drug use: No     Allergies   Buscopan  [scopolamine]   Review of Systems Review of Systems  All other systems reviewed and are negative.    Physical Exam Updated Vital Signs BP 128/80   Pulse (!) 58   Temp (!) 97.4 F (36.3 C) (Oral)   Resp 18   Ht 5\' 1"  (1.549 m)   Wt 70.8 kg (156 lb)   SpO2 100%   BMI 29.48 kg/m   Physical Exam  Constitutional: She is oriented to person, place, and time. She appears well-developed and well-nourished.  HENT:  Head: Normocephalic and atraumatic.  Eyes: Conjunctivae are normal.  Neck: Neck supple.  Cardiovascular: Normal rate and regular rhythm.  Pulmonary/Chest: Effort normal and breath sounds normal.  Abdominal: Soft. Bowel sounds are normal.  Musculoskeletal:  Paraspinous tenderness right greater than left L4, L5, S1; pain with straight leg raise right greater than left  Neurological: She is alert and oriented to person, place, and time.  Skin: Skin is warm and dry.  Psychiatric: She has a normal mood and affect. Her behavior is normal.  Nursing note and vitals reviewed.    ED Treatments / Results  Labs (all labs ordered are listed, but only abnormal results are displayed) Labs Reviewed - No data to  display  EKG  EKG Interpretation None       Radiology Dg Lumbar Spine Complete  Result Date: 09/22/2017 CLINICAL DATA:  Pt was leaning down today and hurt her lumbar spine. Pain is generalized and radiates down her right leg. EXAM: LUMBAR SPINE - COMPLETE 4+ VIEW COMPARISON:  None. FINDINGS: Normal alignment of lumbar vertebral bodies. No loss of vertebral body height or disc height. No pars fracture. No subluxation. IMPRESSION: No acute findings lumbar spine. Electronically Signed   By: Genevive BiStewart  Edmunds M.D.   On: 09/22/2017 13:21    Procedures Procedures (including critical care time)  Medications Ordered in ED Medications  ketorolac (TORADOL) 30 MG/ML injection 60 mg (60 mg Intramuscular Given 09/22/17 1230)     Initial Impression / Assessment and Plan /  ED Course  I have reviewed the triage vital signs and the nursing notes.  Pertinent labs & imaging results that were available during my care of the patient were reviewed by me and considered in my medical decision making (see chart for details).     History and physical most consistent with uncomplicated sciatica.  Because this was a work-related injury, I got a plain film of her lumbar spine which was normal.  Discharge medication ibuprofen 600 mg.  Final Clinical Impressions(s) / ED Diagnoses   Final diagnoses:  Sciatica of right side    ED Discharge Orders        Ordered    ibuprofen (ADVIL,MOTRIN) 600 MG tablet  Every 6 hours PRN     09/22/17 1425       Donnetta Hutchingook, Azani Brogdon, MD 09/22/17 1437

## 2017-09-22 NOTE — ED Triage Notes (Addendum)
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 Pt was bent over attempting to close a wheelchair when she sts she heard a crack in lower back. Pt reports immediate pain that radiates down her R leg.Pt arrives to ED with house supervisor in wheelchair and requires 2 person assist out of wheelchair to bed. Pt sts she is unable to walk. Pt is A&O and in NAD.

## 2017-09-22 NOTE — Discharge Instructions (Signed)
X-ray was normal.  You have nerve pain going down your buttocks which is called sciatica.  Rest, ice, ibuprofen, follow-up with your primary care doctor.

## 2017-09-25 MED FILL — CYCLOBENZAPRINE 5 MG TABLET: 5 | 15 days supply | Qty: 30 | Fill #0

## 2017-12-27 ENCOUNTER — Ambulatory Visit (INDEPENDENT_AMBULATORY_CARE_PROVIDER_SITE_OTHER): Payer: 59 | Admitting: Obstetrics and Gynecology

## 2017-12-27 ENCOUNTER — Encounter: Payer: Self-pay | Admitting: Obstetrics and Gynecology

## 2017-12-27 ENCOUNTER — Ambulatory Visit (HOSPITAL_BASED_OUTPATIENT_CLINIC_OR_DEPARTMENT_OTHER)
Admission: RE | Admit: 2017-12-27 | Discharge: 2017-12-27 | Disposition: A | Discharge status: Home / Self Care | Payer: 59 | Source: Ambulatory Visit | Attending: Obstetrics and Gynecology | Admitting: Obstetrics and Gynecology

## 2017-12-27 VITALS — BP 129/77 | HR 57 | Ht 61.0 in | Wt 156.0 lb

## 2017-12-27 DIAGNOSIS — N760 Acute vaginitis: Secondary | ICD-10-CM | POA: Diagnosis not present

## 2017-12-27 DIAGNOSIS — R1032 Left lower quadrant pain: Secondary | ICD-10-CM | POA: Insufficient documentation

## 2017-12-27 DIAGNOSIS — G8929 Other chronic pain: Secondary | ICD-10-CM | POA: Diagnosis not present

## 2017-12-27 DIAGNOSIS — Z113 Encounter for screening for infections with a predominantly sexual mode of transmission: Secondary | ICD-10-CM | POA: Diagnosis not present

## 2017-12-27 NOTE — Progress Notes (Signed)
52 yo P2 postmenopausal presenting for the evaluation of a vaginal discharge and LLQ pain. Patient reports being treated for BV a few months ago and feels that she still has the infection. She denies the presence of an odor or pruritis but reports abundant white vaginal discharge. She also reports the presence of LLQ intermittent pain. This pain has been present for years but seems to have worsened in frequency over the past month. She reports regular bowel movement and urination pattern. She denies any aggravating factors. The pain seems to resolve spontaneously. She reports a good appetite  Past Medical History:  Diagnosis Date  . Hyperlipidemia    Past Surgical History:  Procedure Laterality Date  . CESAREAN SECTION    . Right axillary cyst removed Right    Family History  Problem Relation Age of Onset  . Hyperlipidemia Father    Social History   Tobacco Use  . Smoking status: Never Smoker  . Smokeless tobacco: Never Used  Substance Use Topics  . Alcohol use: Yes    Comment: occ  . Drug use: No   ROS  See pertinent in HPI  Blood pressure 129/77, pulse (!) 57, height 5\' 1"  (1.549 m), weight 156 lb (70.8 kg).  GENERAL: Well-developed, well-nourished female in no acute distress.  ABDOMEN: Soft, nontender, nondistended. No organomegaly. PELVIC: Normal external female genitalia. Vagina is pink and rugated.  Normal discharge. Normal appearing cervix. Uterus is normal in size. No adnexal mass or tenderness. EXTREMITIES: No cyanosis, clubbing, or edema, 2+ distal pulses.  A/P 52 yo with vaginal discharge and LLQ pain - wet prep collected - pelvic ultrasound ordered - patient will be contacted with abnormal results - RTC prn

## 2017-12-27 NOTE — Progress Notes (Signed)
Patient reports she is having discharge and pain on left side. Armandina StammerJennifer Wendelyn Kiesling RN

## 2017-12-28 LAB — CERVICOVAGINAL ANCILLARY ONLY
Bacterial vaginitis: NEGATIVE
Candida vaginitis: NEGATIVE
Chlamydia: NEGATIVE
NEISSERIA GONORRHEA: NEGATIVE
TRICH (WINDOWPATH): NEGATIVE

## 2017-12-31 ENCOUNTER — Encounter: Payer: Self-pay | Admitting: Internal Medicine

## 2017-12-31 ENCOUNTER — Telehealth: Payer: Self-pay | Admitting: Medical

## 2017-12-31 ENCOUNTER — Ambulatory Visit: Payer: 59 | Admitting: Internal Medicine

## 2017-12-31 VITALS — BP 122/78 | HR 61 | Temp 98.1°F | Resp 14 | Ht 61.0 in | Wt 153.2 lb

## 2017-12-31 DIAGNOSIS — G8929 Other chronic pain: Secondary | ICD-10-CM

## 2017-12-31 DIAGNOSIS — R1032 Left lower quadrant pain: Secondary | ICD-10-CM

## 2017-12-31 LAB — POC URINALSYSI DIPSTICK (AUTOMATED)
BILIRUBIN UA: NEGATIVE
Glucose, UA: NEGATIVE
Ketones, UA: NEGATIVE
Leukocytes, UA: NEGATIVE
Nitrite, UA: NEGATIVE
PH UA: 6.5 (ref 5.0–8.0)
PROTEIN UA: NEGATIVE
RBC UA: NEGATIVE
Spec Grav, UA: 1.015 (ref 1.010–1.025)
UROBILINOGEN UA: 0.2 U/dL

## 2017-12-31 LAB — POCT URINE PREGNANCY: Preg Test, Ur: NEGATIVE

## 2017-12-31 NOTE — Telephone Encounter (Signed)
[facility]. Adv patient both PCP will have to authorized and approve.

## 2017-12-31 NOTE — Telephone Encounter (Signed)
Ok w/ me 

## 2017-12-31 NOTE — Progress Notes (Signed)
Pre visit review using our clinic review tool, if applicable. No additional management support is needed unless otherwise documented below in the visit note. 

## 2017-12-31 NOTE — Progress Notes (Signed)
Subjective:    Patient ID: Melody Perkins, female    DOB: 1966/05/28, 52 y.o.   MRN: [id number]  DOS:  12/31/2017 Type of visit - description : acute Interval history:  Patient saw gynecologist 12/27/2017 with white vaginal discharge and  LLQ abdominal pain, onset was approximately 6 weeks ago and getting slightly worse. The pelvic exam was negative, wet prep negative, including a negative G&C.   Pelvic ultrasound normal except for nonvisualization of the left ovary.   She is here because she continue with the discharge of the left lower quadrant abdominal discomfort. 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  She is a Chief Strategy Officer. She is concerned about possibly diverticulitis.  Review of Systems Denies fever chills or weight loss No dysuria or gross hematuria No nausea or vomiting Reports red blood per rectum only with hard bowel movements in the context of constipation.  Blood does not seem to be mixed with the stools. Continue with white vaginal discharge. Also seen @ the ER 09/2017, with right-sided low back pain, that is largely resolved. Patient is menopausal, LMP age 57.  Past Medical History:  Diagnosis Date  . Hyperlipidemia     Past Surgical History:  Procedure Laterality Date  . CESAREAN SECTION    . Right axillary cyst removed Right     Social History   Socioeconomic History  . Marital status: Married    Spouse name: Not on file  . Number of children: Not on file  . Years of education: Not on file  . Highest education level: Not on file  Occupational History  . Not on file  Social Needs  . Financial resource strain: Not on file  . Food insecurity:    Worry: Not on file    Inability: Not on file  . Transportation needs:    Medical: Not on file    Non-medical: Not on file  Tobacco Use  . Smoking status: Never Smoker  . Smokeless tobacco: Never Used  Substance and Sexual Activity  . Alcohol use:  Yes    Comment: occ  . Drug use: No  . Sexual activity: Yes    Birth control/protection: None  Lifestyle  . Physical activity:    Days per week: Not on file    Minutes per session: Not on file  . Stress: Not on file  Relationships  . Social connections:    Talks on phone: Not on file    Gets together: Not on file    Attends religious service: Not on file    Active member of club or organization: Not on file    Attends meetings of clubs or organizations: Not on file    Relationship status: Not on file  . Intimate partner violence:    Fear of current or ex partner: Not on file    Emotionally abused: Not on file    Physically abused: Not on file    Forced sexual activity: Not on file  Other Topics Concern  . Not on file  Social History Narrative  . Not on file      Allergies as of 12/31/2017      Reactions   Buscopan [scopolamine] Palpitations   Salbutamol [albuterol] Palpitations      Medication List    as of 12/31/2017 11:59 PM   You have not been prescribed any medications.        Objective:   Physical Exam  Abdominal:     BP 122/78 (BP Location: Left Arm, Patient Position: Sitting, Cuff Size: Small)   Pulse 61   Temp 98.1 F (36.7 C) (Oral)   Resp 14   Ht 5\' 1"  (1.549 m)   Wt 153 lb 4 oz (69.5 kg)   SpO2 98%   BMI 28.96 kg/m  General:   Well developed, NAD, see BMI.  HEENT:  Normocephalic . Face symmetric, atraumatic Lungs:  CTA B Normal respiratory effort, no intercostal retractions, no accessory muscle use. Heart: RRR,  no murmur.  no pretibial edema bilaterally  Abdomen:  Not distended, soft, minimal tenderness without mass or rebound in the left quadrant.  See graphic. Groins: No hernia that I can tell Skin: Not pale. Not jaundice Neurologic:  alert & oriented X3.  Speech normal, gait appropriate for age and unassisted Psych--  Cognition and judgment appear intact.  Cooperative with normal attention span and concentration.  Behavior  appropriate. No anxious or depressed appearing.     Assessment & Plan:   LLQ abdominal pain 52 year old female with 6-week history of LLQ abdominal pain. Recent gynecological evaluation negative. Had a colonoscopy 04-2016, normal, actually no report of diverticuli. UPT (-) Plan: UA, urine culture,   CBC and BMP.  Further advised with results,   consider CT. (Request me to be her primary care doctor, okay with me).

## 2017-12-31 NOTE — Patient Instructions (Signed)
  GO TO THE LAB : Get the blood work     

## 2018-01-01 LAB — URINALYSIS, ROUTINE W REFLEX MICROSCOPIC
BILIRUBIN URINE: NEGATIVE
Hgb urine dipstick: NEGATIVE
KETONES UR: NEGATIVE
LEUKOCYTES UA: NEGATIVE
Nitrite: NEGATIVE
PH: 6.5 (ref 5.0–8.0)
RBC / HPF: NONE SEEN (ref 0–?)
SPECIFIC GRAVITY, URINE: 1.01 (ref 1.000–1.030)
Total Protein, Urine: NEGATIVE
URINE GLUCOSE: NEGATIVE
UROBILINOGEN UA: 0.2 (ref 0.0–1.0)

## 2018-01-01 LAB — CBC WITH DIFFERENTIAL/PLATELET
BASOS ABS: 0.1 10*3/uL (ref 0.0–0.1)
BASOS PCT: 1 % (ref 0.0–3.0)
EOS ABS: 0.1 10*3/uL (ref 0.0–0.7)
Eosinophils Relative: 2 % (ref 0.0–5.0)
HCT: 36.7 % (ref 36.0–46.0)
HEMOGLOBIN: 12.4 g/dL (ref 12.0–15.0)
LYMPHS PCT: 35.7 % (ref 12.0–46.0)
Lymphs Abs: 2 10*3/uL (ref 0.7–4.0)
MCHC: 33.8 g/dL (ref 30.0–36.0)
MCV: 82.5 fl (ref 78.0–100.0)
MONO ABS: 0.5 10*3/uL (ref 0.1–1.0)
Monocytes Relative: 9.4 % (ref 3.0–12.0)
Neutro Abs: 3 10*3/uL (ref 1.4–7.7)
Neutrophils Relative %: 51.9 % (ref 43.0–77.0)
Platelets: 225 10*3/uL (ref 150.0–400.0)
RBC: 4.45 Mil/uL (ref 3.87–5.11)
RDW: 14 % (ref 11.5–15.5)
WBC: 5.7 10*3/uL (ref 4.0–10.5)

## 2018-01-01 LAB — BASIC METABOLIC PANEL
BUN: 10 mg/dL (ref 6–23)
CALCIUM: 9.2 mg/dL (ref 8.4–10.5)
CHLORIDE: 105 meq/L (ref 96–112)
CO2: 28 mEq/L (ref 19–32)
CREATININE: 0.55 mg/dL (ref 0.40–1.20)
GFR: 122.5 mL/min (ref >60.00)
Glucose, Bld: 70 mg/dL (ref 70–99)
Potassium: 4 mEq/L (ref 3.5–5.1)
Sodium: 139 mEq/L (ref 135–145)

## 2018-01-01 LAB — URINE CULTURE
MICRO NUMBER: 90751583
RESULT: NO GROWTH
SPECIMEN QUALITY:: ADEQUATE

## 2018-01-01 NOTE — Telephone Encounter (Signed)
Ok to switch 

## 2018-01-02 NOTE — Addendum Note (Signed)
Addended byConrad [place]: Arasely Akkerman D on: 01/02/2018 05:32 PM   Modules accepted: Orders

## 2018-01-02 NOTE — Telephone Encounter (Signed)
Called patient and adv of message, Patient made a Transfer of Care appointment.

## 2018-01-08 ENCOUNTER — Encounter: Payer: 59 | Admitting: Internal Medicine

## 2018-01-18 ENCOUNTER — Ambulatory Visit: Payer: 59 | Admitting: Internal Medicine

## 2018-01-18 ENCOUNTER — Encounter: Payer: Self-pay | Admitting: Internal Medicine

## 2018-01-18 VITALS — BP 116/70 | HR 59 | Temp 97.8°F | Resp 14 | Ht 61.0 in | Wt 154.1 lb

## 2018-01-18 DIAGNOSIS — R1032 Left lower quadrant pain: Secondary | ICD-10-CM

## 2018-01-18 DIAGNOSIS — J339 Nasal polyp, unspecified: Secondary | ICD-10-CM | POA: Diagnosis not present

## 2018-01-18 MED ORDER — AZELASTINE HCL 0.1 % NA SOLN: 2.0000 | Freq: Two times a day (BID) | NASAL | 6 refills | Status: AC

## 2018-01-18 NOTE — Progress Notes (Signed)
Pre visit review using our clinic review tool, if applicable. No additional management support is needed unless otherwise documented below in the visit note. 

## 2018-01-18 NOTE — Patient Instructions (Signed)
Next visit in 6 months  For nose congestion: OTC Flonase 2 sprays on each side of the nose every morning Astelin: 2 sprays on each side of the morning twice a day, see prescription Call if not better in few weeks, you will need to see the ENT doctor

## 2018-01-18 NOTE — Progress Notes (Signed)
Subjective:    Patient ID: Melody Perkins, female    DOB: Jul 18, 1965, 52 y.o.   MRN: [id number]  DOS:  01/18/2018 Type of visit - description : To get established Interval history: Here to get established, chart reviewed. Was seen with LLQ abdominal pain, overall is a slightly better but still has occasional pain and is concerned.  Is her observation that pain is decreased when she has a "good BM". Also concerned about 1 month history of right nostril congestion, has been tried to "clean" of the area with Q-tips etc., saw some bleeding.    Review of Systems  Again denies fever, chills, weight loss or blood in the stools. Has changed her diet, eating more fruits and vegetables, she feels that that has help her to have healthier BMs. Denies cough, odynophagia, or heartburn per se.   Past Medical History:  Diagnosis Date  . Hyperlipidemia     Past Surgical History:  Procedure Laterality Date  . CESAREAN SECTION     x 1   . Right axillary cyst removed Right     Social History   Socioeconomic History  . Marital status: Married    Spouse name: 2  . Number of children: Not on file  . Years of education: Not on file  . Highest education level: Not on file  Occupational History  . 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 Financial resource strain: Not on file  . Food insecurity:    Worry: Not on file    Inability: Not on file  . Transportation needs:    Medical: Not on file    Non-medical: Not on file  Tobacco Use  . Smoking status: Never Smoker  . Smokeless tobacco: Never Used  Substance and Sexual Activity  . Alcohol use: Yes    Comment: occ  . Drug use: No  . Sexual activity: Yes    Birth control/protection: None  Lifestyle  . Physical activity:    Days per week: Not on file    Minutes per session: Not on file  . Stress: Not on file  Relationships  . Social connections:    Talks on phone: Not on file    Gets together: Not on file   Attends religious service: Not on file    Active member of club or organization: Not on file    Attends meetings of clubs or organizations: Not on file    Relationship status: Not on file  . Intimate partner violence:    Fear of current or ex partner: Not on file    Emotionally abused: Not on file    Physically abused: Not on file    Forced sexual activity: Not on file  Other Topics Concern  . Not on file  Social History Narrative   Original from [place], moved to [place] ~ 2007      Allergies as of 01/18/2018      Reactions   Buscopan [scopolamine] Palpitations   Salbutamol [albuterol] Palpitations      Medication List        Accurate as of 01/18/18 11:59 PM. Always use your most recent med list.          azelastine 0.1 % nasal spray Commonly known as:  ASTELIN Place 2 sprays into both nostrils 2 (two) times daily.          Objective:   Physical Exam BP 116/70 (BP Location: Right Arm, Patient Position: Sitting, Cuff Size:  Small)   Pulse (!) 59   Temp 97.8 F (36.6 C) (Oral)   Resp 14   Ht 5\' 1"  (1.549 m)   Wt 154 lb 2 oz (69.9 kg)   SpO2 99%   BMI 29.12 kg/m  General:   Well developed, NAD, see BMI.  HEENT:  Normocephalic . Face symmetric, atraumatic. TMs normal.  Throat symmetric, not red, tonsils very small Nose: Left side normal, right side: Has either redundant turbinate or a polyp.  No ulcers, bleeding or other lesions. Lungs:  CTA B Normal respiratory effort, no intercostal retractions, no accessory muscle use. Heart: RRR,  no murmur.  no pretibial edema bilaterally  Abdomen:  Not distended, soft, not actually tender at the LLQ but points to the distal left lower quadrant as the area of pain. No groin hernias on either side. Skin: Not pale. Not jaundice Neurologic:  alert & oriented X3.  Speech normal, gait appropriate for age and unassisted Psych--  Cognition and judgment appear intact.  Cooperative with normal attention span and concentration.    Behavior appropriate. No anxious or depressed appearing.     Assessment & Plan:   Assessment new patient 01-2018 Hyperlipidemia   Plan: LLQ abdominal pain: See last visit, labs and urine test negative.  She already had gyn evaluation with a normal pelvic ultrasound.   She is concerned and likes to know where the pain coming from, I told the patient that at this point I cannot say for certain, IBS? We discussed options: continue with increased fiber diet and observation.  GI referral.  CT scan.  Patient elected a CT.  Will arrange Nasal polyp versus turbinate redundancy: Recommend Flonase on Astelin, if not better will need a ENT referral. RTC 6 months  Today, I spent more than   25 min with the patient: >50% of the time counseling regards  LLQ pain, reviewed previous w/o, options, multiple questions answer

## 2018-01-19 DIAGNOSIS — Z09 Encounter for follow-up examination after completed treatment for conditions other than malignant neoplasm: Secondary | ICD-10-CM | POA: Insufficient documentation

## 2018-01-19 NOTE — Assessment & Plan Note (Signed)
LLQ abdominal pain: See last visit, labs and urine test negative.  She already had gyn evaluation with a normal pelvic ultrasound.   She is concerned and likes to know where the pain coming from, I told the patient that at this point I cannot say for certain, IBS? We discussed options: continue with increased fiber diet and observation.  GI referral.  CT scan.  Patient elected a CT.  Will arrange Nasal polyp versus turbinate redundancy: Recommend Flonase on Astelin, if not better will need a ENT referral. RTC 6 months

## 2018-02-11 ENCOUNTER — Encounter: Payer: Self-pay | Admitting: Internal Medicine

## 2018-02-14 ENCOUNTER — Ambulatory Visit (HOSPITAL_BASED_OUTPATIENT_CLINIC_OR_DEPARTMENT_OTHER): Payer: 59

## 2018-02-15 ENCOUNTER — Ambulatory Visit (HOSPITAL_BASED_OUTPATIENT_CLINIC_OR_DEPARTMENT_OTHER)
Admission: RE | Admit: 2018-02-15 | Discharge: 2018-02-15 | Disposition: A | Discharge status: Home / Self Care | Payer: 59 | Source: Ambulatory Visit | Attending: Internal Medicine | Admitting: Internal Medicine

## 2018-02-15 ENCOUNTER — Telehealth: Payer: Self-pay | Admitting: Family

## 2018-02-15 DIAGNOSIS — R1032 Left lower quadrant pain: Secondary | ICD-10-CM | POA: Diagnosis not present

## 2018-02-15 DIAGNOSIS — R109 Unspecified abdominal pain: Secondary | ICD-10-CM | POA: Diagnosis not present

## 2018-02-15 MED ORDER — IOPAMIDOL (ISOVUE-300) INJECTION 61%
100.0000 mL | Freq: Once | INTRAVENOUS | Status: AC | PRN
  Administered 2018-02-15: 100 mL via INTRAVENOUS

## 2018-02-15 NOTE — Telephone Encounter (Signed)
See my chart message

## 2018-02-26 ENCOUNTER — Encounter: Payer: Self-pay | Admitting: Internal Medicine

## 2018-04-24 ENCOUNTER — Encounter: Payer: Self-pay | Admitting: Internal Medicine

## 2018-04-24 ENCOUNTER — Ambulatory Visit: Payer: 59 | Admitting: Internal Medicine

## 2018-04-24 VITALS — BP 116/70 | HR 59 | Temp 98.2°F | Resp 16 | Ht 61.0 in | Wt 155.2 lb

## 2018-04-24 DIAGNOSIS — R51 Headache: Secondary | ICD-10-CM | POA: Diagnosis not present

## 2018-04-24 DIAGNOSIS — H9201 Otalgia, right ear: Secondary | ICD-10-CM

## 2018-04-24 DIAGNOSIS — Z566 Other physical and mental strain related to work: Secondary | ICD-10-CM

## 2018-04-24 DIAGNOSIS — R519 Headache, unspecified: Secondary | ICD-10-CM

## 2018-04-24 MED ORDER — NORTRIPTYLINE HCL 10 MG PO CAPS: 10.0000 mg | ORAL_CAPSULE | Freq: Every day | ORAL | 0 refills | Status: AC

## 2018-04-24 NOTE — Progress Notes (Signed)
Pre visit review using our clinic review tool, if applicable. No additional management support is needed unless otherwise documented below in the visit note. 

## 2018-04-24 NOTE — Progress Notes (Signed)
Subjective:    Patient ID: Melody Perkins, female    DOB: 04/10/1966, 52 y.o.   MRN: [id number]  DOS:  04/24/2018 Type of visit - description : acute  Interval history: Has a long life history of headache when face w/ stressful situations.  Patient reports that she is under a lot of stress at work and when she is there she develops a headache, usually right-sided, not associated with nausea, vomiting, photophobia. Denies having the worst headache of her life, that was back in 2017 when she learned her husband had cancer.  Also, right ear ache since a airplane flight 2 months ago. Mild tinnitus, no decreased hearing. Denies any ear discharge.  Review of Systems  Admits to some difficulty sleeping due to stress at work. Was recently seen with nasal congestion and a polyp, symptoms resolved after using nasal sprays for 2 weeks.  Did not get to see ENT.  Past Medical History:  Diagnosis Date  . Hyperlipidemia     Past Surgical History:  Procedure Laterality Date  . CESAREAN SECTION     x 1   . Right axillary cyst removed Right     Social History   Socioeconomic History  . Marital status: Married    Spouse name: 2  . Number of children: Not on file  . Years of education: Not on file  . Highest education level: Not on file  Occupational History  . 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 Financial resource strain: Not on file  . Food insecurity:    Worry: Not on file    Inability: Not on file  . Transportation needs:    Medical: Not on file    Non-medical: Not on file  Tobacco Use  . Smoking status: Never Smoker  . Smokeless tobacco: Never Used  Substance and Sexual Activity  . Alcohol use: Yes    Comment: occ  . Drug use: No  . Sexual activity: Yes    Birth control/protection: None  Lifestyle  . Physical activity:    Days per week: Not on file    Minutes per session: Not on file  . Stress: Not on file  Relationships  . Social connections:      Talks on phone: Not on file    Gets together: Not on file    Attends religious service: Not on file    Active member of club or organization: Not on file    Attends meetings of clubs or organizations: Not on file    Relationship status: Not on file  . Intimate partner violence:    Fear of current or ex partner: Not on file    Emotionally abused: Not on file    Physically abused: Not on file    Forced sexual activity: Not on file  Other Topics Concern  . Not on file  Social History Narrative   Original from [place], moved to [place] ~ 2007      Allergies as of 04/24/2018      Reactions   Buscopan [scopolamine] Palpitations   Salbutamol [albuterol] Palpitations      Medication List        Accurate as of 04/24/18 11:59 PM. Always use your most recent med list.          azelastine 0.1 % nasal spray Commonly known as:  ASTELIN Place 2 sprays into both nostrils 2 (two) times daily.   nortriptyline 10 MG capsule Commonly known as:  PAMELOR Take 1-2 capsules (10-20 mg total) by mouth at bedtime.          Objective:   Physical Exam BP 116/70 (BP Location: Left Arm, Patient Position: Sitting, Cuff Size: Small)   Pulse (!) 59   Temp 98.2 F (36.8 C) (Oral)   Resp 16   Ht 5\' 1"  (1.549 m)   Wt 155 lb 4 oz (70.4 kg)   SpO2 98%   BMI 29.33 kg/m  General:   Well developed, NAD, see BMI.  HEENT:  Normocephalic . Face symmetric, atraumatic. TMs normal.  Throat symmetric and not red.  Sinuses no TTP. TMJ: Not tender or click. Nose: Turbinates are slightly enlarged in both sides, no evidence of a polyp on simple  inspection.  No discharge. Skin: Not pale. Not jaundice Neurologic:  alert & oriented X3.  Speech normal, gait appropriate for age and unassisted EOMI, pupils equal and reactive.  Motor symmetric. Psych--  Cognition and judgment appear intact.  Cooperative with normal attention span and concentration.  Behavior appropriate. Apprehensive appearing.       Assessment & Plan:    Assessment new patient 01-2018 Hyperlipidemia   Plan: Headache: Many years history of headaches when she is in a stressful situation, she is having a lot of stress at work.  The headache is not the worst of her life and reports is not different from previous episodes. Recommend a trial with nortriptyline for what seems to be tensional headache.  Take 1 or 2 tablets at bedtime.  Needs to avoid stressful situations if possible.  I asked her to come back in 4 weeks for reevaluation, sooner if needed.  If  Has a different, or intense headaches she will need imaging. Right ear pain: Etiology unclear, started 2 months ago.  ET dysfunction?.  Recommend to continue using a nasal spray and call if not better. LLQ abdominal pain: See last visit, CT was negative Nasal polyp: See last visit, symptoms resolved after she used nasal sprays, did not pursue ENT referral.    Stress at work: Listening therapy provided, seems like she has a lot of problems there, recommend to talk with HR. RTC 4 weeks for reevaluation of the headache.   Today, I spent more than  25  min with the patient: >50% of the time counseling regards HA (new issue to me), providing listening therapy for stress

## 2018-04-24 NOTE — Patient Instructions (Signed)
Next visit in 4 weeks.  Please make an appointment  For the headache: Take 1 or 2 tablets or nortriptyline at night. If you feel too sleepy the next morning take just one tablet.  For the ear ache: Continue using the nasal spray every day  Call if you have any severe or unusual headache.

## 2018-04-25 DIAGNOSIS — R51 Headache: Secondary | ICD-10-CM

## 2018-04-25 DIAGNOSIS — R519 Headache, unspecified: Secondary | ICD-10-CM | POA: Insufficient documentation

## 2018-04-25 NOTE — Assessment & Plan Note (Signed)
Headache: Many years history of headaches when she is in a stressful situation, she is having a lot of stress at work.  The headache is not the worst of her life and reports is not different from previous episodes. Recommend a trial with nortriptyline for what seems to be tensional headache.  Take 1 or 2 tablets at bedtime.  Needs to avoid stressful situations if possible.  I asked her to come back in 4 weeks for reevaluation, sooner if needed.  If  Has a different, or intense headaches she will need imaging. Right ear pain: Etiology unclear, started 2 months ago.  ET dysfunction?.  Recommend to continue using a nasal spray and call if not better. LLQ abdominal pain: See last visit, CT was negative Nasal polyp: See last visit, symptoms resolved after she used nasal sprays, did not pursue ENT referral.    Stress at work: Listening therapy provided, seems like she has a lot of problems there, recommend to talk with HR. RTC 4 weeks for reevaluation of the headache.

## 2018-05-24 ENCOUNTER — Encounter: Payer: Self-pay | Admitting: Internal Medicine

## 2018-05-24 ENCOUNTER — Ambulatory Visit: Payer: 59 | Admitting: Internal Medicine

## 2018-05-29 ENCOUNTER — Ambulatory Visit: Payer: 59 | Admitting: Internal Medicine

## 2018-06-19 ENCOUNTER — Telehealth: Payer: Self-pay | Admitting: Internal Medicine

## 2018-06-19 NOTE — Telephone Encounter (Signed)
Pt states she wanted to switch to [facility] b/c it is close to her home. She states they will not accept her as a patient b/c you are her primary care provider. Will you allow her to become a patient at the [facility]? Please advise.

## 2018-06-19 NOTE — Telephone Encounter (Signed)
Ok with me 

## 2018-06-24 NOTE — Telephone Encounter (Signed)
Called pt on Thursday but phone kept disconnecting. Tried to leave voicemail on 06/24/18 but mailbox was full.

## 2018-07-02 NOTE — Telephone Encounter (Signed)
Got in touch with patient and let her know Dr. Drue NovelPaz approved her transfer.

## 2018-07-26 ENCOUNTER — Ambulatory Visit: Payer: Self-pay | Admitting: Internal Medicine

## 2018-08-07 ENCOUNTER — Other Ambulatory Visit (HOSPITAL_COMMUNITY)
Admission: RE | Admit: 2018-08-07 | Discharge: 2018-08-07 | Disposition: A | Discharge status: Home / Self Care | Payer: 59 | Source: Ambulatory Visit | Attending: Family Medicine | Admitting: Family Medicine

## 2018-08-07 ENCOUNTER — Encounter: Payer: Self-pay | Admitting: Family Medicine

## 2018-08-07 ENCOUNTER — Ambulatory Visit: Payer: 59 | Admitting: Family Medicine

## 2018-08-07 VITALS — BP 130/90 | HR 58 | Temp 97.9°F | Ht 61.0 in | Wt 147.2 lb

## 2018-08-07 DIAGNOSIS — N898 Other specified noninflammatory disorders of vagina: Secondary | ICD-10-CM | POA: Insufficient documentation

## 2018-08-07 DIAGNOSIS — Z630 Problems in relationship with spouse or partner: Secondary | ICD-10-CM
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
## 2018-08-09 LAB — CERVICOVAGINAL ANCILLARY ONLY
Bacterial vaginitis: NEGATIVE
CANDIDA VAGINITIS: NEGATIVE
Chlamydia: NEGATIVE
Neisseria Gonorrhea: NEGATIVE
Trichomonas: NEGATIVE

## 2018-08-09 NOTE — Progress Notes (Signed)
Please call pt to let her know vaginal swab negative/normal

## 2018-09-20 ENCOUNTER — Other Ambulatory Visit: Payer: Self-pay

## 2018-09-20 ENCOUNTER — Encounter: Payer: Self-pay | Admitting: Family Medicine

## 2018-09-20 ENCOUNTER — Ambulatory Visit: Payer: 59 | Admitting: Family Medicine

## 2018-09-20 VITALS — BP 106/80 | HR 53 | Temp 98.4°F | Ht 61.0 in | Wt 146.8 lb

## 2018-09-20 DIAGNOSIS — J069 Acute upper respiratory infection, unspecified: Secondary | ICD-10-CM

## 2018-09-20 NOTE — Patient Instructions (Addendum)
Drink plenty of fluids, especially water Rest Use humidifier at night Use nasal saline spray at least 3 times per day Try Mucinex 1 tab twice per day Follow-up if symptoms worsen or do not improve in 7-10 days

## 2018-09-20 NOTE — Progress Notes (Signed)
Melody Perkins is a 53 y.o. female  Chief Complaint  Patient presents with  . Headache    headache/ runny nose/ joint pain/ post nasal drip/ scratchy throat/ 2 days/ lemon tea    HPI: Melody Perkins is a 53 y.o. female complains of 2 day h/o headache, runny nose, PND with scratchy throat. No fever, chills. No ear pain or nasal congestion. No cough, SOB, wheeze. Pt does endorse joint pains.  She has been drinking lemon tea and 400mg  ibuprofen yesterday AM.  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 No household contacts are sick. No recent travel.  Her husband has cancer so pt wanted to be seen just to "be cautious".   Past Medical History:  Diagnosis Date  . Hyperlipidemia     Past Surgical History:  Procedure Laterality Date  . CESAREAN SECTION     x 1   . Right axillary cyst removed Right     Social History   Socioeconomic History  . Marital status: Married    Spouse name: 2  . Number of children: Not on file  . Years of education: Not on file  . Highest education level: Not on file  Occupational History  . 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 Financial resource strain: Not on file  . Food insecurity:    Worry: Not on file    Inability: Not on file  . Transportation needs:    Medical: Not on file    Non-medical: Not on file  Tobacco Use  . Smoking status: Never Smoker  . Smokeless tobacco: Never Used  Substance and Sexual Activity  . Alcohol use: Yes    Comment: occ  . Drug use: No  . Sexual activity: Yes    Birth control/protection: None  Lifestyle  . Physical activity:    Days per week: Not on file    Minutes per session: Not on file  . Stress: Not on file  Relationships  . Social connections:    Talks on phone: Not on file    Gets together: Not on file    Attends religious service: Not on file    Active member of club or organization: Not on file    Attends meetings of clubs or organizations: Not on file   Relationship status: Not on file  . Intimate partner violence:    Fear of current or ex partner: Not on file    Emotionally abused: Not on file    Physically abused: Not on file    Forced sexual activity: Not on file  Other Topics Concern  . Not on file  Social History Narrative   Original from [place], moved to [place] ~ 2007    Family History  Problem Relation Age of Onset  . Hyperlipidemia Father   . Colon cancer Neg Hx   . Prostate cancer Neg Hx   . Breast cancer Neg Hx      Immunization History  Administered Date(s) Administered  . Influenza-Unspecified 03/18/2016, 04/09/2018  . Tdap 01/29/2005, 11/29/2015    Outpatient Encounter Medications as of 09/20/2018  Medication Sig  . azelastine (ASTELIN) 0.1 % nasal spray Place 2 sprays into both nostrils 2 (two) times daily. (Patient not taking: Reported on 04/24/2018)  . nortriptyline (PAMELOR) 10 MG capsule Take 1-2 capsules (10-20 mg total) by mouth at bedtime. (Patient not taking: Reported on 08/07/2018)   No facility-administered encounter medications on file as of 09/20/2018.  ROS: Pertinent positives and negatives noted in HPI. Remainder of ROS non-contributory    Allergies  Allergen Reactions  . Buscopan [Scopolamine] Palpitations  . Salbutamol [Albuterol] Palpitations    BP 106/80   Pulse (!) 53   Temp 98.4 F (36.9 C) (Oral)   Ht 5\' 1"  (1.549 m)   Wt 146 lb 12.8 oz (66.6 kg)   SpO2 98%   BMI 27.74 kg/m   Physical Exam  Constitutional: She is oriented to person, place, and time. She appears well-developed and well-nourished. No distress.  HENT:  Head: Normocephalic and atraumatic.  Right Ear: Tympanic membrane and ear canal normal.  Left Ear: Tympanic membrane and ear canal normal.  Nose: Nose normal. Right sinus exhibits no maxillary sinus tenderness and no frontal sinus tenderness. Left sinus exhibits no maxillary sinus tenderness and no frontal sinus tenderness.  Mouth/Throat: Oropharynx is clear  and moist and mucous membranes are normal.  Neck: Neck supple.  Cardiovascular: Normal rate and regular rhythm.  Pulmonary/Chest: Effort normal and breath sounds normal. No respiratory distress. She has no wheezes. She has no rhonchi.  Lymphadenopathy:    She has no cervical adenopathy.  Neurological: She is alert and oriented to person, place, and time.     A/P:  1. Viral URI - cont supportive care to include increased fluids, rest, tylenol or ibuprofen PRN - nasal saline spray, mucinex BID - work note given for today - f/u if symptoms worsen or do not improve in 7-10 days Discussed plan and reviewed medications with patient, including risks, benefits, and potential side effects. Pt expressed understand. All questions answered.

## 2019-04-07 ENCOUNTER — Ambulatory Visit: Payer: 59 | Admitting: Nurse Practitioner

## 2019-04-07 ENCOUNTER — Encounter: Payer: Self-pay | Admitting: Nurse Practitioner

## 2019-04-07 ENCOUNTER — Ambulatory Visit: Payer: Self-pay | Admitting: *Deleted

## 2019-04-07 ENCOUNTER — Other Ambulatory Visit: Payer: Self-pay

## 2019-04-07 VITALS — BP 108/80 | HR 50 | Temp 97.7°F | Ht 61.0 in | Wt 139.4 lb

## 2019-04-07 DIAGNOSIS — R002 Palpitations: Secondary | ICD-10-CM | POA: Diagnosis not present

## 2019-04-07 LAB — TSH: TSH: 1.88 u[IU]/mL (ref 0.35–4.50)

## 2019-04-07 LAB — CBC
HCT: 39.6 % (ref 36.0–46.0)
Hemoglobin: 13 g/dL (ref 12.0–15.0)
MCHC: 32.7 g/dL (ref 30.0–36.0)
MCV: 84.2 fl (ref 78.0–100.0)
Platelets: 240 10*3/uL (ref 150.0–400.0)
RBC: 4.7 Mil/uL (ref 3.87–5.11)
RDW: 13.7 % (ref 11.5–15.5)
WBC: 6.4 10*3/uL (ref 4.0–10.5)

## 2019-04-07 LAB — BASIC METABOLIC PANEL
BUN: 9 mg/dL (ref 6–23)
CO2: 28 mEq/L (ref 19–32)
Calcium: 9.6 mg/dL (ref 8.4–10.5)
Chloride: 107 mEq/L (ref 96–112)
Creatinine, Ser: 0.56 mg/dL (ref 0.40–1.20)
GFR: 112.36 mL/min (ref >60.00)
Glucose, Bld: 87 mg/dL (ref 70–99)
Potassium: 4.2 mEq/L (ref 3.5–5.1)
Sodium: 144 mEq/L (ref 135–145)

## 2019-04-07 NOTE — Patient Instructions (Addendum)
Avoid tea that may have caused symptoms.  Normal lab results   Palpitations Palpitations are feelings that your heartbeat is not normal. Your heartbeat may feel like it is:  Uneven.  Faster than normal.  Fluttering.  Skipping a beat. This is usually not a serious problem. In some cases, you may need tests to rule out any serious problems. Follow these instructions at home: Pay attention to any changes in your condition. Take these actions to help manage your symptoms: Eating and drinking  Avoid: ? Coffee, tea, soft drinks, and energy drinks. ? Chocolate. ? Alcohol. ? Diet pills. Lifestyle   Try to lower your stress. These things can help you relax: ? Yoga. ? Deep breathing and meditation. ? Exercise. ? Using words and images to create positive thoughts (guided imagery). ? Using your mind to control things in your body (biofeedback).  Do not use drugs.  Get plenty of rest and sleep. Keep a regular bed time. General instructions   Take over-the-counter and prescription medicines only as told by your doctor.  Do not use any products that contain nicotine or tobacco, such as cigarettes and e-cigarettes. If you need help quitting, ask your doctor.  Keep all follow-up visits as told by your doctor. This is important. You may need more tests if palpitations do not go away or get worse. Contact a doctor if:  Your symptoms last more than 24 hours.  Your symptoms occur more often. Get help right away if you:  Have chest pain.  Feel short of breath.  Have a very bad headache.  Feel dizzy.  Pass out (faint). Summary  Palpitations are feelings that your heartbeat is uneven or faster than normal. It may feel like your heart is fluttering or skipping a beat.  Avoid food and drinks that may cause palpitations. These include caffeine, chocolate, and alcohol.  Try to lower your stress. Do not smoke or use drugs.  Get help right away if you faint or have chest pain,  shortness of breath, a severe headache, or dizziness. This information is not intended to replace advice given to you by your health care provider. Make sure you discuss any questions you have with your health care provider. Document Released: 04/04/2008 Document Revised: 08/08/2017 Document Reviewed: 08/08/2017 Elsevier Patient Education  2020 [employer].

## 2019-04-07 NOTE — Progress Notes (Signed)
Subjective:  Patient ID: Melody Perkins, female    DOB: Jul 10, 1966  Age: 53 y.o. MRN: [id number]  CC: Palpitations (pt is of heart palpitation yesterda since she start to drink tea/had left arm numbness last night but no symptoms today. )  Palpitations  This is a recurrent problem. The current episode started yesterday. The problem occurs intermittently. The problem has been resolved. On average, each episode lasts 2 hours. Exacerbated by: possible due to herbal tea (non-caffeine) Associated symptoms include anxiety, chest fullness, chest pain, coughing, an irregular heartbeat and numbness. Pertinent negatives include no diaphoresis, dizziness, fever, malaise/fatigue, nausea, near-syncope, shortness of breath, syncope, vomiting or weakness. She has tried bed rest for the symptoms. The treatment provided significant relief. Risk factors include post menopause and stress. Her past medical history is significant for anxiety. There is no history of anemia, heart disease or hyperthyroidism.  reports increase anxiety over death of husband and family conflict over property.  Reviewed past Medical, Social and Family history today.  Outpatient Medications Prior to Visit  Medication Sig Dispense Refill  . azelastine (ASTELIN) 0.1 % nasal spray Place 2 sprays into both nostrils 2 (two) times daily. (Patient not taking: Reported on 04/24/2018) 30 mL 6  . nortriptyline (PAMELOR) 10 MG capsule Take 1-2 capsules (10-20 mg total) by mouth at bedtime. (Patient not taking: Reported on 08/07/2018) 60 capsule 0   No facility-administered medications prior to visit.     ROS See HPI  Objective:  BP 108/80   Pulse (!) 50   Temp 97.7 F (36.5 C) (Tympanic)   Ht 5\' 1"  (1.549 m)   Wt 139 lb 6.4 oz (63.2 kg)   SpO2 95%   BMI 26.34 kg/m   BP Readings from Last 3 Encounters:  04/07/19 108/80  09/20/18 106/80  08/07/18 130/90    Wt Readings from Last 3 Encounters:  04/07/19 139 lb 6.4 oz (63.2 kg)  09/20/18  146 lb 12.8 oz (66.6 kg)  08/07/18 147 lb 3.2 oz (66.8 kg)    Physical Exam Vitals signs reviewed.  Cardiovascular:     Rate and Rhythm: Normal rate and regular rhythm.     Pulses: Normal pulses.     Heart sounds: Normal heart sounds.  Pulmonary:     Effort: Pulmonary effort is normal.     Breath sounds: Normal breath sounds.  Musculoskeletal:     Right lower leg: No edema.     Left lower leg: No edema.  Neurological:     Mental Status: She is alert and oriented to person, place, and time.    Lab Results  Component Value Date   WBC 6.4 04/07/2019   HGB 13.0 04/07/2019   HCT 39.6 04/07/2019   PLT 240.0 04/07/2019   GLUCOSE 87 04/07/2019   CHOL 263 (H) 03/09/2017   TRIG 66.0 03/09/2017   HDL 64.70 03/09/2017   LDLCALC 185 (H) 03/09/2017   ALT 14 03/21/2017   AST 17 03/21/2017   NA 144 04/07/2019   K 4.2 04/07/2019   CL 107 04/07/2019   CREATININE 0.56 04/07/2019   BUN 9 04/07/2019   CO2 28 04/07/2019   TSH 1.88 04/07/2019   HGBA1C 5.5 03/21/2017    No results found.  Assessment & Plan:   Aaryanna was seen today for palpitations.  Diagnoses and all orders for this visit:  Intermittent palpitations -     TSH -     Basic metabolic panel -     CBC   I  am having Cobi C. Fidalgo maintain her azelastine and nortriptyline.  No orders of the defined types were placed in this encounter.   Problem List Items Addressed This Visit    None    Visit Diagnoses    Intermittent palpitations    -  Primary   Relevant Orders   TSH (Completed)   Basic metabolic panel (Completed)   CBC (Completed)      Follow-up: Return if symptoms worsen or fail to improve.  Alysia Penna, NP

## 2019-04-07 NOTE — Telephone Encounter (Signed)
Patient is calling to report she has felt changes in her pulse rate starting last night- although the rates are not extremely high- patient is under extreme amount of stress with recent passing of her husband and trouble with remaining family. Patient con come to office today - but has to work the rest of the week. Call to office for appointment.  Reason for Disposition . [1] Palpitations AND [2] no improvement after using CARE ADVICE  Answer Assessment - Initial Assessment Questions 1. DESCRIPTION: "Please describe your heart rate or heart beat that you are having" (e.g., fast/slow, regular/irregular, skipped or extra beats, "palpitations")     Patient feels her heart rate is fast- fast/slow 2. ONSET: "When did it start?" (Minutes, hours or days)      Last night 3. DURATION: "How long does it last" (e.g., seconds, minutes, hours)     Few minutes 4. PATTERN "Does it come and go, or has it been constant since it started?"  "Does it get worse with exertion?"   "Are you feeling it now?"     Comes and goes, patient is feeling it laying down- she has not been up, patient feels it now- jumping from 62- 90- 53 5. TAP: "Using your hand, can you tap out what you are feeling on a chair or table in front of you, so that I can hear?" (Note: not all patients can do this)       n/a 6. HEART RATE: "Can you tell me your heart rate?" "How many beats in 15 seconds?"  (Note: not all patients can do this)       55 7. RECURRENT SYMPTOM: "Have you ever had this before?" If so, ask: "When was the last time?" and "What happened that time?"      Never before 8. CAUSE: "What do you think is causing the palpitations?"     Patient did have tea- she does not normally drink tea 9. CARDIAC HISTORY: "Do you have any history of heart disease?" (e.g., heart attack, angina, bypass surgery, angioplasty, arrhythmia)      no 10. OTHER SYMPTOMS: "Do you have any other symptoms?" (e.g., dizziness, chest pain, sweating, difficulty  breathing)       no 11. PREGNANCY: "Is there any chance you are pregnant?" "When was your last menstrual period?"       n/a  Protocols used: HEART RATE AND HEARTBEAT QUESTIONS-A-AH

## 2019-04-16 ENCOUNTER — Other Ambulatory Visit: Payer: Self-pay | Admitting: Physician Assistant

## 2019-04-16 ENCOUNTER — Other Ambulatory Visit: Payer: Self-pay

## 2019-04-16 ENCOUNTER — Ambulatory Visit: Payer: Self-pay

## 2019-04-16 DIAGNOSIS — M25561 Pain in right knee: Secondary | ICD-10-CM

## 2019-05-07 ENCOUNTER — Telehealth: Payer: Self-pay | Admitting: Internal Medicine

## 2019-05-07 NOTE — Telephone Encounter (Signed)
Pt is now seeing Dr. Bryan Lemma.

## 2019-05-07 NOTE — Telephone Encounter (Signed)
thx

## 2019-05-07 NOTE — Telephone Encounter (Signed)
History of a L lung 4 mm nodule  on CT a year ago. Please arrange chest CT without contrast: DX pulmonary nodule follow-up She is also due for her routine checkup

## 2019-06-17 ENCOUNTER — Emergency Department (HOSPITAL_COMMUNITY)
Admission: EM | Admit: 2019-06-17 | Discharge: 2019-06-17 | Disposition: A | Discharge status: Home / Self Care | Payer: 59 | Attending: Emergency Medicine | Admitting: Emergency Medicine

## 2019-06-17 ENCOUNTER — Other Ambulatory Visit: Payer: Self-pay

## 2019-06-17 ENCOUNTER — Emergency Department (HOSPITAL_COMMUNITY): Payer: 59

## 2019-06-17 ENCOUNTER — Encounter (HOSPITAL_COMMUNITY): Payer: Self-pay | Admitting: *Deleted

## 2019-06-17 DIAGNOSIS — R109 Unspecified abdominal pain: Secondary | ICD-10-CM | POA: Diagnosis not present

## 2019-06-17 DIAGNOSIS — R1013 Epigastric pain: Secondary | ICD-10-CM | POA: Insufficient documentation

## 2019-06-17 DIAGNOSIS — R0789 Other chest pain: Secondary | ICD-10-CM | POA: Insufficient documentation

## 2019-06-17 DIAGNOSIS — R079 Chest pain, unspecified: Secondary | ICD-10-CM | POA: Diagnosis not present

## 2019-06-17 LAB — HEPATIC FUNCTION PANEL
ALT: 14 U/L (ref 0–44)
AST: 18 U/L (ref 15–41)
Albumin: 4.3 g/dL (ref 3.5–5.0)
Alkaline Phosphatase: 71 U/L (ref 38–126)
Bilirubin, Direct: <0.1 mg/dL (ref 0.0–0.2)
Total Bilirubin: 0.4 mg/dL (ref 0.3–1.2)
Total Protein: 7.3 g/dL (ref 6.5–8.1)

## 2019-06-17 LAB — BASIC METABOLIC PANEL
Anion gap: 9 (ref 5–15)
BUN: 10 mg/dL (ref 6–20)
CO2: 24 mmol/L (ref 22–32)
Calcium: 9.3 mg/dL (ref 8.9–10.3)
Chloride: 108 mmol/L (ref 98–111)
Creatinine, Ser: 0.42 mg/dL — ABNORMAL LOW (ref 0.44–1.00)
GFR calc Af Amer: >60 mL/min (ref >60)
GFR calc non Af Amer: >60 mL/min (ref >60)
Glucose, Bld: 95 mg/dL (ref 70–99)
Potassium: 3.8 mmol/L (ref 3.5–5.1)
Sodium: 141 mmol/L (ref 135–145)

## 2019-06-17 LAB — CBC
HCT: 41.5 % (ref 36.0–46.0)
Hemoglobin: 13.7 g/dL (ref 12.0–15.0)
MCH: 27.8 pg (ref 26.0–34.0)
MCHC: 33 g/dL (ref 30.0–36.0)
MCV: 84.2 fL (ref 80.0–100.0)
Platelets: 268 10*3/uL (ref 150–400)
RBC: 4.93 MIL/uL (ref 3.87–5.11)
RDW: 13.2 % (ref 11.5–15.5)
WBC: 7.2 10*3/uL (ref 4.0–10.5)
nRBC: 0 % (ref 0.0–0.2)

## 2019-06-17 LAB — TROPONIN I (HIGH SENSITIVITY)
Troponin I (High Sensitivity): 5 ng/L (ref <18)
Troponin I (High Sensitivity): 5 ng/L (ref <18)

## 2019-06-17 LAB — I-STAT BETA HCG BLOOD, ED (MC, WL, AP ONLY): I-stat hCG, quantitative: <5 m[IU]/mL (ref <5)

## 2019-06-17 LAB — LIPASE, BLOOD: Lipase: 30 U/L (ref 11–51)

## 2019-06-17 MED ORDER — ALUM & MAG HYDROXIDE-SIMETH 200-200-20 MG/5ML PO SUSP
30.0000 mL | Freq: Once | ORAL | Status: AC
  Administered 2019-06-17: 13:00:00 30 mL via ORAL
  Filled 2019-06-17: qty 30

## 2019-06-17 MED ORDER — HYDROXYZINE HCL 25 MG PO TABS: 25.0000 mg | ORAL_TABLET | Freq: Every evening | ORAL | 0 refills | Status: AC | PRN

## 2019-06-17 MED ORDER — SODIUM CHLORIDE 0.9% FLUSH: 3.0000 mL | Freq: Once | INTRAVENOUS | Status: DC

## 2019-06-17 NOTE — Discharge Instructions (Signed)
Use Maalox or Tums as needed for discomfort similar to what you had today. Use Tylenol or ibuprofen as needed for pain. Follow-up with your primary care doctor as needed for further evaluation of your symptoms. Return to the emergency room if you develop any new, worsening, or concerning symptoms.

## 2019-06-17 NOTE — ED Notes (Signed)
Pt verbalized understanding of discharge instructions. Prescriptions, pain management, and follow up care reviewed. Pt ambulated independently to lobby.

## 2019-06-17 NOTE — ED Provider Notes (Signed)
[facility]   CSN: [id number] Arrival date & time: 06/17/19  [id number]     History   Chief Complaint Chief Complaint  Patient presents with  . Chest Pain    HPI Melody Perkins is a 53 y.o. female presenting for evaluation of lower chest/upper abdominal pain.  Patient states last night after eating pineapple persimmon she started to develop upper abdominal/lower chest pain.  She describes it as a pressure.  She states she has some mild discomfort in her left upper abdomen, as such she thinks her symptoms may be GI in nature.  Patient states pain radiates to her right shoulder, but that might be because she did not sleep well last night.  She denies associated shortness of breath, nausea, vomiting, diaphoresis.  She denies recent fevers, chills, cough, lower abdominal pain, urinary symptoms, abnormal bowel movements.  Patient states he has been very stressed recently, as her husband recently died and left her with lots of bills.  She has been working a lot, and she feels her stress is contributing to her symptoms today.  She denies history of cardiac problems.  She denies tobacco, alcohol, or drug use.  She denies family history of heart problems.  She has no other medical problems, takes no medications daily.  She does report a history of heartburn, treats it symptomatically with home remedies.  She does feel her heartburn symptoms have been worse recently, and this could be contributing to her symptoms today.     HPI  Past Medical History:  Diagnosis Date  . Hyperlipidemia     Patient Active Problem List   Diagnosis Date Noted  . Recurrent headache 04/25/2018  . PCP NOTES >>>>>> 01/19/2018  . Trigger finger, right ring finger 09/03/2017  . Hyperlipidemia 07/09/2015  . Breast pain 07/16/2012    Past Surgical History:  Procedure Laterality Date  . CESAREAN SECTION     x 1   . Right axillary cyst removed Right      OB History    Gravida  4   Para  2   Term  0   Preterm  2   AB  2   Living  2     SAB  2   TAB      Ectopic      Multiple      Live Births               Home Medications    Prior to Admission medications   Medication Sig Start Date End Date Taking? Authorizing Provider  azelastine (ASTELIN) 0.1 % nasal spray Place 2 sprays into both nostrils 2 (two) times daily. Patient not taking: Reported on 04/24/2018 01/18/18   Wanda PlumpPaz, Jose E, MD  nortriptyline (PAMELOR) 10 MG capsule Take 1-2 capsules (10-20 mg total) by mouth at bedtime. Patient not taking: Reported on 08/07/2018 04/24/18   Wanda PlumpPaz, Jose E, MD    Family History Family History  Problem Relation Age of Onset  . Hyperlipidemia Father   . Colon cancer Neg Hx   . Prostate cancer Neg Hx   . Breast cancer Neg Hx     Social History Social History   Tobacco Use  . Smoking status: Never Smoker  . Smokeless tobacco: Never Used  Substance Use Topics  . Alcohol use: Yes    Comment: occ  . Drug use: No     Allergies   Buscopan [scopolamine] and Salbutamol [albuterol]   Review of  Systems Review of Systems  Gastrointestinal:       Epigastric abdominal pain  All other systems reviewed and are negative.    Physical Exam Updated Vital Signs BP 123/80 (BP Location: Right Arm)   Pulse 75   Temp 98.2 F (36.8 C) (Oral)   Resp 12   SpO2 100%   Physical Exam Vitals signs and nursing note reviewed.  Constitutional:      General: She is not in acute distress.    Appearance: She is well-developed.     Comments: Resting comfortably in the bed in no acute distress  HENT:     Head: Normocephalic and atraumatic.  Eyes:     Conjunctiva/sclera: Conjunctivae normal.     Pupils: Pupils are equal, round, and reactive to light.  Neck:     Musculoskeletal: Normal range of motion and neck supple.  Cardiovascular:     Rate and Rhythm: Normal rate and regular rhythm.     Pulses: Normal pulses.  Pulmonary:     Effort:  Pulmonary effort is normal. No respiratory distress.     Breath sounds: Normal breath sounds. No wheezing.     Comments: Speaking in full sentences.  Clear lung sounds in all fields. Abdominal:     General: There is no distension.     Palpations: Abdomen is soft. There is no mass.     Tenderness: There is abdominal tenderness. There is no guarding or rebound.     Comments: Mild discomfort with palpation of the epigastric abdomen.  No tenderness palpation of the abdomen.  No rigidity, guarding, distention.  Negative rebound.  Musculoskeletal: Normal range of motion.     Right lower leg: No edema.     Left lower leg: No edema.  Skin:    General: Skin is warm and dry.     Capillary Refill: Capillary refill takes less than 2 seconds.  Neurological:     Mental Status: She is alert and oriented to person, place, and time.      ED Treatments / Results  Labs (all labs ordered are listed, but only abnormal results are displayed) Labs Reviewed  BASIC METABOLIC PANEL - Abnormal; Notable for the following components:      Result Value   Creatinine, Ser 0.42 (*)    All other components within normal limits  CBC  HEPATIC FUNCTION PANEL  LIPASE, BLOOD  I-STAT BETA HCG BLOOD, ED ([facility], [facility], [facility] ONLY)  TROPONIN I (HIGH SENSITIVITY)  TROPONIN I (HIGH SENSITIVITY)    EKG EKG Interpretation  Date/Time:  Tuesday June 17 2019 08:52:52 EST Ventricular Rate:  55 PR Interval:  158 QRS Duration: 90 QT Interval:  436 QTC Calculation: 417 R Axis:   77 Text Interpretation: Sinus bradycardia Otherwise normal ECG Confirmed by Marianna Fuss ([id number]) on 06/17/2019 12:53:34 PM   Radiology Dg Chest 2 View  Result Date: 06/17/2019 CLINICAL DATA:  Onset chest pain and weakness this morning. EXAM: CHEST - 2 VIEW COMPARISON:  None. FINDINGS: Lungs clear. Heart size normal. No pneumothorax or pleural fluid. No acute or focal bony abnormality. IMPRESSION: Negative chest. Electronically Signed   By:  Drusilla Kanner M.D.   On: 06/17/2019 09:33   US Abdomen Limited Ruq  Result Date: 06/17/2019 CLINICAL DATA:  Abdominal pain for 1 month. EXAM: ULTRASOUND ABDOMEN LIMITED RIGHT UPPER QUADRANT COMPARISON:  None. FINDINGS: Gallbladder: Gallbladder has a normal appearance. Gallbladder wall is 1.8 millimeters, within normal limits. No stones or pericholecystic fluid. No sonographic Murphy's sign. Common bile  duct: Diameter: Thick 2.7 millimeters Liver: No focal lesion identified. Within normal limits in parenchymal echogenicity. Portal vein is patent on color Doppler imaging with normal direction of blood flow towards the liver. Other: None. IMPRESSION: Normal evaluation of the RIGHT UPPER QUADRANT. Electronically Signed   By: Nolon Nations M.D.   On: 06/17/2019 14:52    Procedures Procedures (including critical care time)  Medications Ordered in ED Medications  sodium chloride flush (NS) 0.9 % injection 3 mL (3 mLs Intravenous Not Given 06/17/19 1307)  alum & mag hydroxide-simeth (MAALOX/MYLANTA) 200-200-20 MG/5ML suspension 30 mL (30 mLs Oral Given 06/17/19 1304)     Initial Impression / Assessment and Plan / ED Course  I have reviewed the triage vital signs and the nursing notes.  Pertinent labs & imaging results that were available during my care of the patient were reviewed by me and considered in my medical decision making (see chart for details).        Pt presenting for evaluation of chest pain/epigastric abdominal pain.  Physical exam reassuring, she appears nontoxic.  Symptoms began after eating acidic foods.  Patient reports increased anxiety and stress recently.  No risk factors for heart problems.  Doubt ACS, PE, pneumonia, pneumothorax, or dissection.  Likely GI in nature.  Labs obtained from triage reassuring, hemoglobin stable, electrolytes stable, creatinine stable, initial troponin negative.  EKG shows sinus bradycardia, patient reports a history of the same.  Chest x-ray  viewed interpreted by me, no pneumonia pneumothorax and effusion, cardiomegaly.  As her pain began last night, will perform repeat troponin to ensure no change.  Will give Maalox and reassess.  As patient's pain radiates to her right shoulder, consider gallbladder etiology, although negative Murphy's.  Will obtain ultrasound for further evaluation.  Lipase and LFTs normal.  Ultrasound negative for acute findings.  Repeat troponin negative.  On reassessment, patient reports pain is resolved with Maalox.  Discussed findings with patient.  Discussed likely reflux/anxiety.  Discussed close follow-up with PCP for further evaluation.  Upon discharge, patient asking for something to help her sleep at night due to her anxiety.  Will give hydroxyzine as needed, but encourage patient to follow-up with her primary care doctor for further management.  At this time, patient appears safe for discharge.  Return precautions given.  Patient states she understands and agrees to plan.   Final Clinical Impressions(s) / ED Diagnoses   Final diagnoses:  Epigastric abdominal pain  Atypical chest pain    ED Discharge Orders    None       Franchot Heidelberg, PA-C 06/17/19 1544    Lucrezia Starch, MD 06/18/19 1356

## 2019-06-17 NOTE — ED Triage Notes (Signed)
Pt reports having mid chest pressure today, no sob. Reports recently having difficulty swallowing and also increase in stress level. No acute distress is noted at this time. ekg done.

## 2019-06-17 NOTE — ED Notes (Signed)
Pt given water for PO/Fluid challenge 

## 2021-09-23 IMAGING — DX DG KNEE COMPLETE 4+V*R*
4 series · 4 of 4 positions shown · non-contrast
Comparison: None.

CLINICAL DATA: Persistent medial knee pain since blunt trauma 5
days ago.

EXAM:
RIGHT KNEE - COMPLETE 4+ VIEW

[knee pa]
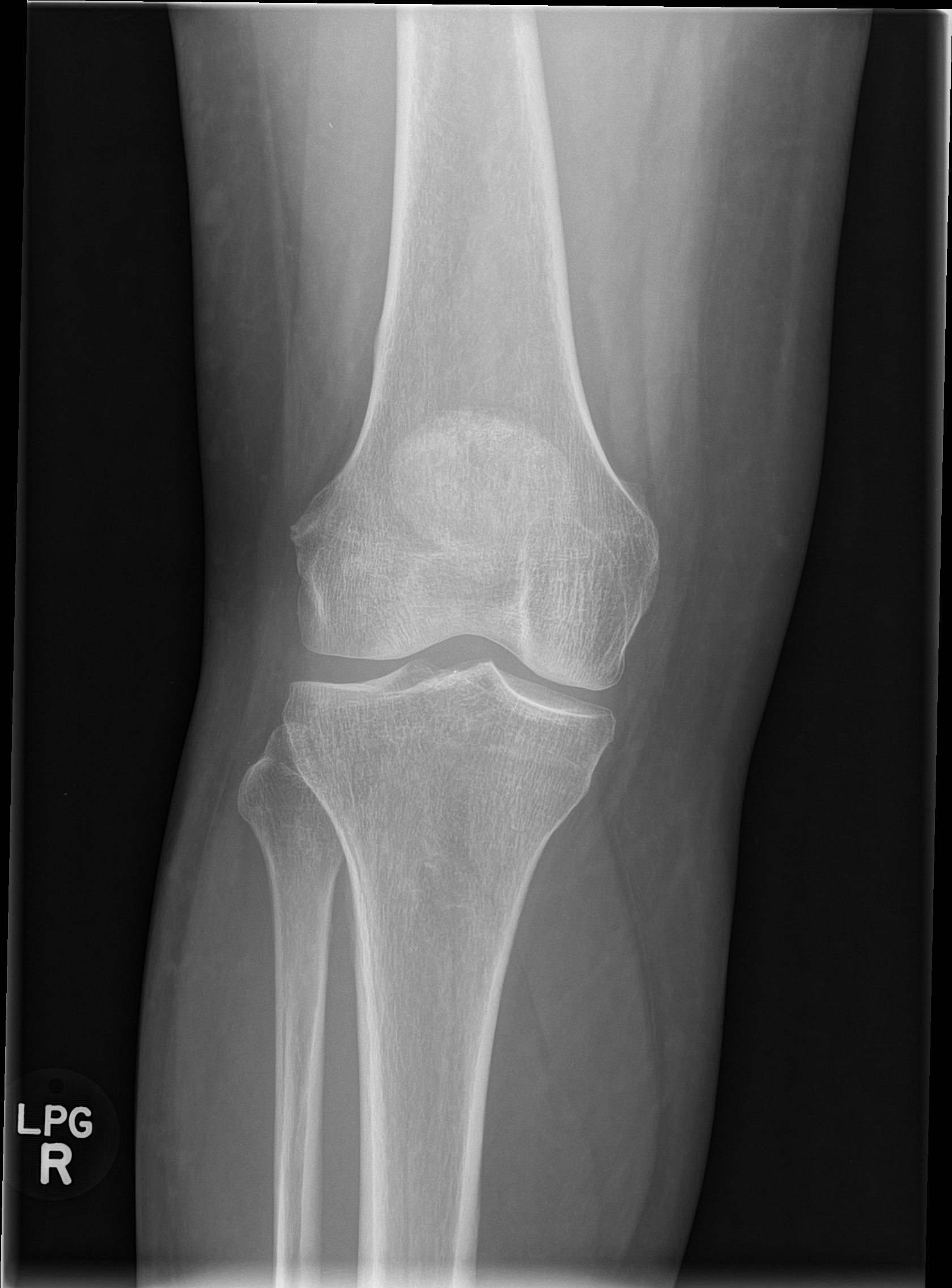

[knee obl (1 of 2)]
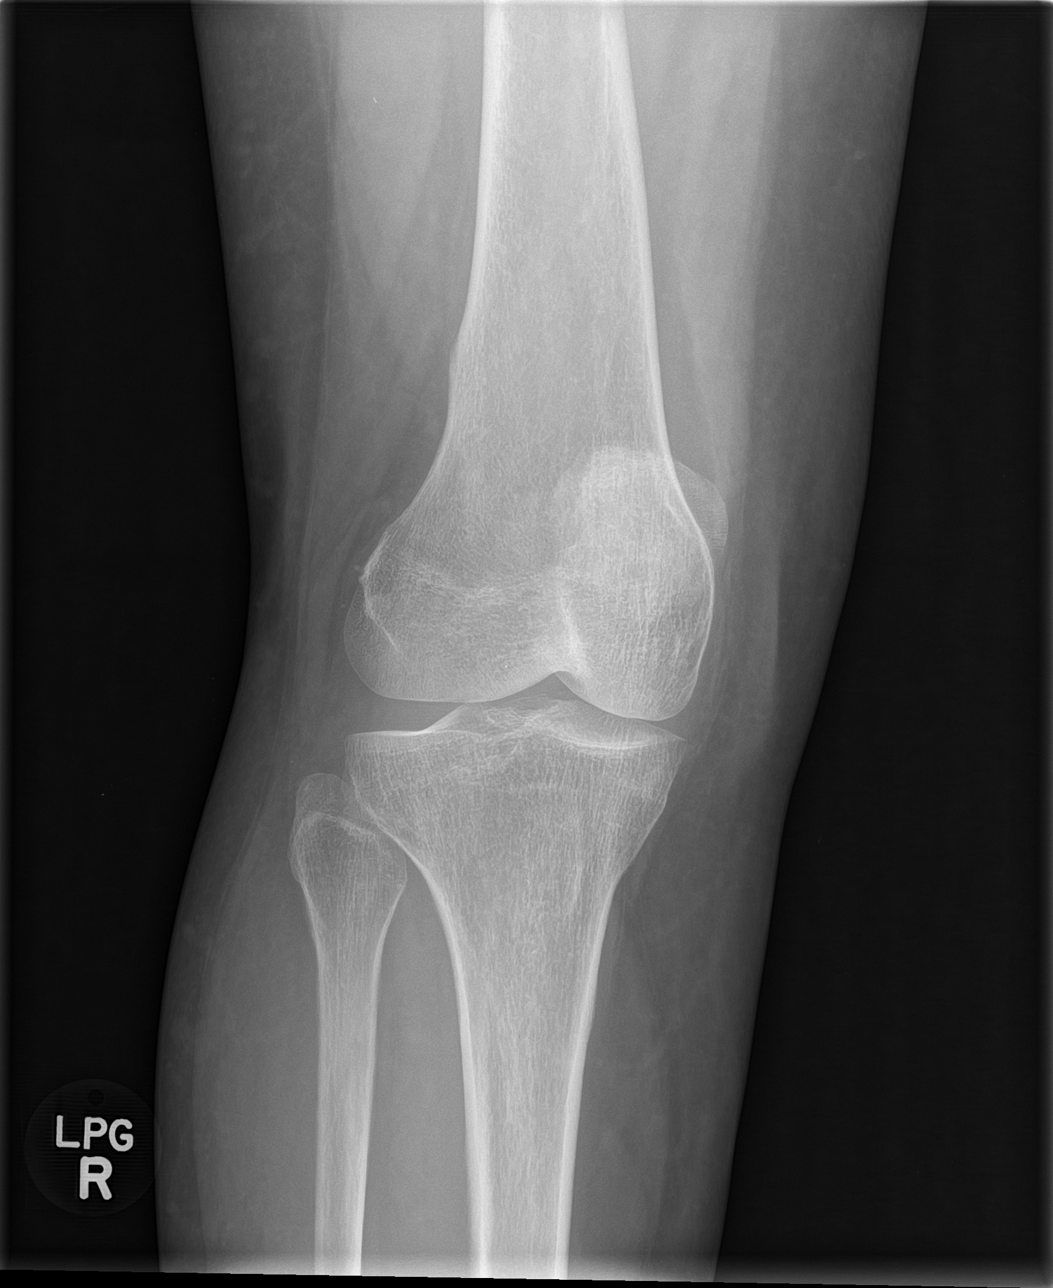

[knee obl (2 of 2)]
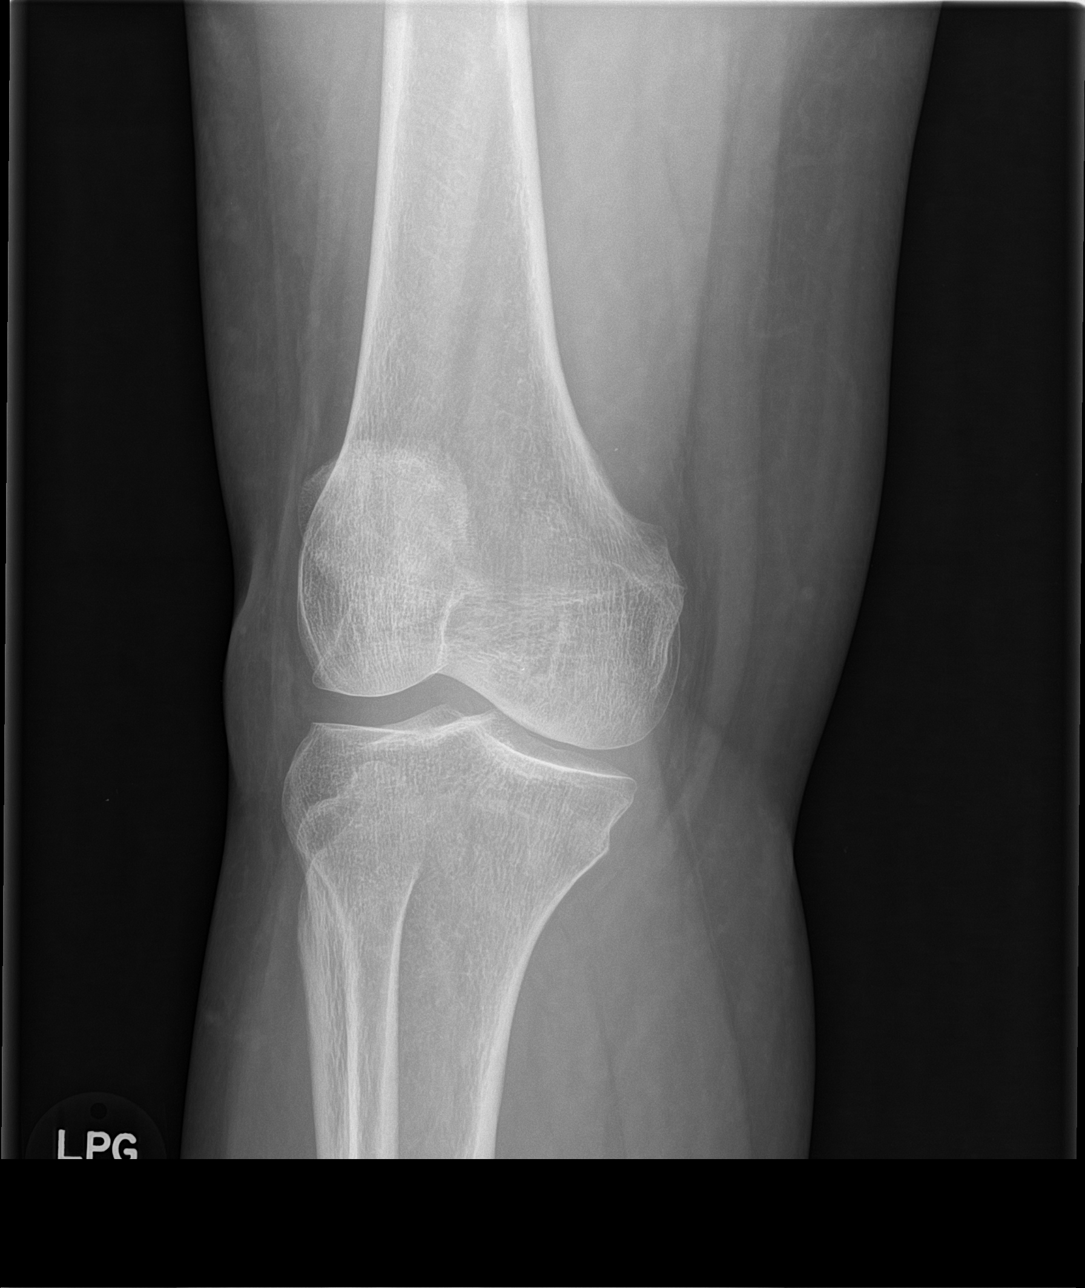

[knee lat]
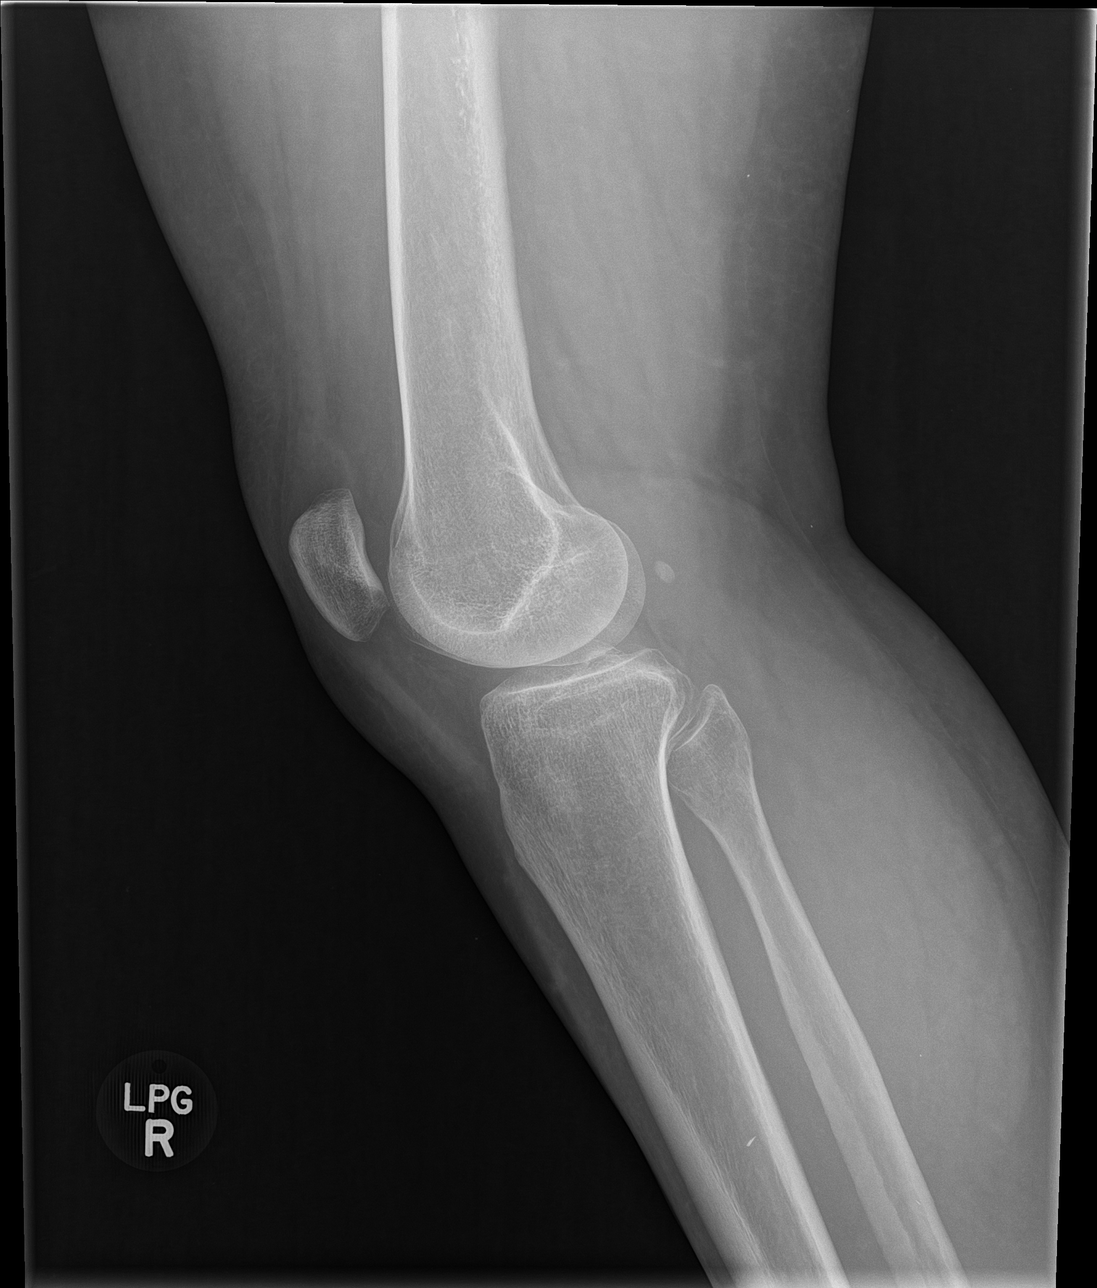

[4 of 4 positions shown; findings below may reference images not displayed]

FINDINGS: No evidence of fracture, dislocation, or joint effusion. No evidence
of arthropathy or other focal bone abnormality. Soft tissues are
unremarkable.
IMPRESSION: Negative.
